# Patient Record
Sex: Female | Born: 1995 | Race: Black or African American | Hispanic: No | Marital: Single | Smoking: Current some day smoker
Health system: Southern US, Community
[De-identification: ages and names within clinical notes are randomized; demographics above are authoritative.]

## PROBLEM LIST (undated history)

## (undated) ENCOUNTER — Emergency Department (HOSPITAL_COMMUNITY): Admission: EM | Payer: Medicaid Other | Source: Home / Self Care

## (undated) DIAGNOSIS — F419 Anxiety disorder, unspecified: Secondary | ICD-10-CM

## (undated) DIAGNOSIS — F329 Major depressive disorder, single episode, unspecified: Secondary | ICD-10-CM

## (undated) DIAGNOSIS — F32A Depression, unspecified: Secondary | ICD-10-CM

---
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
## 2015-01-29 ENCOUNTER — Emergency Department (HOSPITAL_COMMUNITY): Payer: Medicaid Other

## 2015-01-29 ENCOUNTER — Emergency Department (HOSPITAL_COMMUNITY)
Admission: EM | Admit: 2015-01-29 | Discharge: 2015-01-30 | Disposition: A | Discharge status: Other Acute Inpatient Hospital | Payer: Medicaid Other | Attending: Emergency Medicine | Admitting: Emergency Medicine

## 2015-01-29 ENCOUNTER — Encounter (HOSPITAL_COMMUNITY): Payer: Self-pay

## 2015-01-29 DIAGNOSIS — R079 Chest pain, unspecified: Secondary | ICD-10-CM | POA: Diagnosis not present

## 2015-01-29 DIAGNOSIS — Y9289 Other specified places as the place of occurrence of the external cause: Secondary | ICD-10-CM | POA: Insufficient documentation

## 2015-01-29 DIAGNOSIS — R1010 Upper abdominal pain, unspecified: Secondary | ICD-10-CM | POA: Diagnosis not present

## 2015-01-29 DIAGNOSIS — F322 Major depressive disorder, single episode, severe without psychotic features: Secondary | ICD-10-CM | POA: Insufficient documentation

## 2015-01-29 DIAGNOSIS — Y998 Other external cause status: Secondary | ICD-10-CM | POA: Diagnosis not present

## 2015-01-29 DIAGNOSIS — T40602A Poisoning by unspecified narcotics, intentional self-harm, initial encounter: Secondary | ICD-10-CM

## 2015-01-29 DIAGNOSIS — Z3202 Encounter for pregnancy test, result negative: Secondary | ICD-10-CM | POA: Diagnosis not present

## 2015-01-29 DIAGNOSIS — Y9389 Activity, other specified: Secondary | ICD-10-CM | POA: Diagnosis not present

## 2015-01-29 DIAGNOSIS — R51 Headache: Secondary | ICD-10-CM | POA: Diagnosis not present

## 2015-01-29 DIAGNOSIS — T402X2A Poisoning by other opioids, intentional self-harm, initial encounter: Secondary | ICD-10-CM | POA: Diagnosis present

## 2015-01-29 LAB — CBC WITH DIFFERENTIAL/PLATELET
Basophils Absolute: 0 10*3/uL (ref 0.0–0.1)
Basophils Relative: 0 %
EOS PCT: 0 %
Eosinophils Absolute: 0 10*3/uL (ref 0.0–0.7)
HCT: 37.5 % (ref 36.0–46.0)
Hemoglobin: 12.2 g/dL (ref 12.0–15.0)
LYMPHS ABS: 1.3 10*3/uL (ref 0.7–4.0)
LYMPHS PCT: 18 %
MCH: 30.3 pg (ref 26.0–34.0)
MCHC: 32.5 g/dL (ref 30.0–36.0)
MCV: 93.3 fL (ref 78.0–100.0)
MONO ABS: 0.8 10*3/uL (ref 0.1–1.0)
MONOS PCT: 11 %
Neutro Abs: 5.2 10*3/uL (ref 1.7–7.7)
Neutrophils Relative %: 71 %
PLATELETS: 322 10*3/uL (ref 150–400)
RBC: 4.02 MIL/uL (ref 3.87–5.11)
RDW: 12.7 % (ref 11.5–15.5)
WBC: 7.3 10*3/uL (ref 4.0–10.5)

## 2015-01-29 LAB — COMPREHENSIVE METABOLIC PANEL
ALBUMIN: 4 g/dL (ref 3.5–5.0)
ALT: 10 U/L — AB (ref 14–54)
AST: 39 U/L (ref 15–41)
Alkaline Phosphatase: 56 U/L (ref 38–126)
Anion gap: 11 (ref 5–15)
BUN: 13 mg/dL (ref 6–20)
CHLORIDE: 106 mmol/L (ref 101–111)
CO2: 21 mmol/L — AB (ref 22–32)
CREATININE: 0.94 mg/dL (ref 0.44–1.00)
Calcium: 9.4 mg/dL (ref 8.9–10.3)
GFR calc Af Amer: >60 mL/min (ref >60)
GFR calc non Af Amer: >60 mL/min (ref >60)
GLUCOSE: 83 mg/dL (ref 65–99)
POTASSIUM: 5.3 mmol/L — AB (ref 3.5–5.1)
Sodium: 138 mmol/L (ref 135–145)
Total Bilirubin: 1.5 mg/dL — ABNORMAL HIGH (ref 0.3–1.2)
Total Protein: 7.5 g/dL (ref 6.5–8.1)

## 2015-01-29 LAB — URINALYSIS, ROUTINE W REFLEX MICROSCOPIC
Bilirubin Urine: NEGATIVE
Glucose, UA: NEGATIVE mg/dL
HGB URINE DIPSTICK: NEGATIVE
Ketones, ur: >80 mg/dL — AB
Leukocytes, UA: NEGATIVE
Nitrite: NEGATIVE
PH: 6.5 (ref 5.0–8.0)
Protein, ur: NEGATIVE mg/dL
SPECIFIC GRAVITY, URINE: 1.037 — AB (ref 1.005–1.030)

## 2015-01-29 LAB — RAPID URINE DRUG SCREEN, HOSP PERFORMED
AMPHETAMINES: NOT DETECTED
Barbiturates: NOT DETECTED
Benzodiazepines: NOT DETECTED
COCAINE: NOT DETECTED
OPIATES: NOT DETECTED
TETRAHYDROCANNABINOL: NOT DETECTED

## 2015-01-29 LAB — ACETAMINOPHEN LEVEL: Acetaminophen (Tylenol), Serum: <10 ug/mL — ABNORMAL LOW (ref 10–30)

## 2015-01-29 LAB — ETHANOL: Alcohol, Ethyl (B): <5 mg/dL (ref <5)

## 2015-01-29 LAB — PREGNANCY, URINE: Preg Test, Ur: NEGATIVE

## 2015-01-29 LAB — LIPASE, BLOOD: LIPASE: 23 U/L (ref 11–51)

## 2015-01-29 LAB — SALICYLATE LEVEL: Salicylate Lvl: <4 mg/dL (ref 2.8–30.0)

## 2015-01-29 NOTE — ED Notes (Signed)
Pt. To [facility] from ED ambulatory without difficulty, to room 36 . [facility] Pt. Is alert and oriented, warm and dry in no distress. Pt. Denies SI, HI, and AVH. Pt. Calm and cooperative. Pt. Made aware of security cameras and Q15 minute rounds. Pt. Encouraged to let Nursing staff know of any concerns or needs.
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
## 2015-01-29 NOTE — ED Notes (Signed)
[facility]
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
## 2015-01-29 NOTE — ED Notes (Signed)
Bed: [id number] Expected date:  Expected time:  Means of arrival:  Comments: si attempt

## 2015-01-30 ENCOUNTER — Encounter (HOSPITAL_COMMUNITY): Payer: Self-pay | Admitting: *Deleted

## 2015-01-30 ENCOUNTER — Inpatient Hospital Stay (HOSPITAL_COMMUNITY)
Admission: AD | Admit: 2015-01-30 | Discharge: 2015-02-03 | DRG: 885 | Disposition: A | Discharge status: Home / Self Care | Payer: Medicaid Other | Source: Intra-hospital | Attending: Psychiatry | Admitting: Psychiatry

## 2015-01-30 DIAGNOSIS — R45851 Suicidal ideations: Secondary | ICD-10-CM | POA: Diagnosis not present

## 2015-01-30 DIAGNOSIS — T402X2A Poisoning by other opioids, intentional self-harm, initial encounter: Secondary | ICD-10-CM | POA: Diagnosis not present

## 2015-01-30 DIAGNOSIS — Z823 Family history of stroke: Secondary | ICD-10-CM | POA: Diagnosis not present

## 2015-01-30 DIAGNOSIS — G4700 Insomnia, unspecified: Secondary | ICD-10-CM | POA: Diagnosis present

## 2015-01-30 DIAGNOSIS — F323 Major depressive disorder, single episode, severe with psychotic features: Secondary | ICD-10-CM | POA: Diagnosis not present

## 2015-01-30 DIAGNOSIS — F4325 Adjustment disorder with mixed disturbance of emotions and conduct: Secondary | ICD-10-CM | POA: Diagnosis present

## 2015-01-30 DIAGNOSIS — F411 Generalized anxiety disorder: Secondary | ICD-10-CM | POA: Diagnosis present

## 2015-01-30 DIAGNOSIS — Z62810 Personal history of physical and sexual abuse in childhood: Secondary | ICD-10-CM | POA: Diagnosis present

## 2015-01-30 LAB — CBG MONITORING, ED: GLUCOSE-CAPILLARY: 80 mg/dL (ref 65–99)

## 2015-01-30 MED ORDER — PROMETHAZINE HCL 25 MG PO TABS: 25.0000 mg | ORAL_TABLET | Freq: Four times a day (QID) | ORAL | Status: DC | PRN

## 2015-01-30 MED ORDER — TRAZODONE HCL 50 MG PO TABS
50.0000 mg | ORAL_TABLET | Freq: Every evening | ORAL | Status: DC | PRN
  Administered 2015-01-30: 50 mg via ORAL

## 2015-01-30 MED ORDER — SODIUM CHLORIDE 0.9 % IV BOLUS (SEPSIS): 1000.0000 mL | Freq: Once | INTRAVENOUS | Status: DC

## 2015-01-30 MED ORDER — PROMETHAZINE HCL 25 MG PO TABS
25.0000 mg | ORAL_TABLET | Freq: Once | ORAL | Status: AC
  Administered 2015-01-30: 25 mg via ORAL
  Filled 2015-01-30: qty 1

## 2015-01-30 MED ORDER — ALUM & MAG HYDROXIDE-SIMETH 200-200-20 MG/5ML PO SUSP
30.0000 mL | ORAL | Status: DC | PRN
  Administered 2015-02-02: 30 mL via ORAL
  Filled 2015-01-30: qty 30

## 2015-01-30 MED ORDER — ONDANSETRON HCL 4 MG PO TABS
4.0000 mg | ORAL_TABLET | Freq: Three times a day (TID) | ORAL | Status: DC | PRN
  Administered 2015-01-30: 4 mg via ORAL
  Filled 2015-01-30: qty 1

## 2015-01-30 MED ORDER — MAGNESIUM HYDROXIDE 400 MG/5ML PO SUSP: 30.0000 mL | Freq: Every day | ORAL | Status: DC | PRN

## 2015-01-30 NOTE — ED Notes (Signed)
Pt. Given oral fluids. 

## 2015-01-30 NOTE — ED Notes (Addendum)
Dr Deretha EmoryZackowski into see.  Pt declined breakfast, no vomiting since report

## 2015-01-30 NOTE — ED Notes (Signed)
On the phone 

## 2015-01-30 NOTE — ED Notes (Signed)
Friend into see 

## 2015-01-30 NOTE — ED Notes (Signed)
Hold pt until after shift [facility]

## 2015-01-30 NOTE — ED Notes (Signed)
Up on the phone 

## 2015-01-30 NOTE — ED Notes (Signed)
Ashley NottinghamJamison DNP updated, [facility]

## 2015-01-30 NOTE — ED Notes (Addendum)
Pt reports slight nausea when going from laying to sitting, and dizzyness when going from sitting to standing.  Continuing to encouraged PO fluids.  Pt also reports that she has passed out when she has taken a  shower recently.

## 2015-01-30 NOTE — ED Notes (Addendum)
Friend in w/ pt.  Verbal permission from pt to discuss admission w/ friend.  PO fluids encouraged, pt still report itching. nad

## 2015-01-30 NOTE — ED Notes (Signed)
Report received from Lizbeth BarkJanie Rambo RN. Pt. Alert and oriented in no distress denies SI, HI, and pain.  Pt. States she is seeing "people with no faces" and "hearing voices telling her to get up". Pt. Instructed to come to me with problems or concerns.Will continue to monitor for safety via security cameras and Q 15 minute checks.

## 2015-01-30 NOTE — ED Notes (Signed)
Pt. Noted in room. No complaints or concerns voiced. No distress or abnormal behavior noted. Will continue to monitor with security cameras. Q 15 minute rounds continue. 

## 2015-01-30 NOTE — ED Notes (Addendum)
Orthostatic BPs done: Sitting 126/70 HR 92 Standing 117/70 HR 101

## 2015-01-30 NOTE — ED Notes (Signed)
Pt reports that nausea has improved, bit still had some dizzyness when she got up to the phone.  Pt encouraged to drink, saltine crackers given.

## 2015-01-30 NOTE — Tx Team (Signed)
Initial Interdisciplinary Treatment Plan   PATIENT STRESSORS: Financial difficulties Marital or family conflict Occupational concerns   PATIENT STRENGTHS: Average or above average intelligence Communication skills General fund of knowledge   PROBLEM LIST: Problem List/Patient Goals Date to be addressed Date deferred Reason deferred Estimated date of resolution  " not sure" "trying to not want to kill myself" 01-30-15     Depressed 01-30-15     Suicide attempt 01-30-15     Anxious 01-30-15                                    DISCHARGE CRITERIA:  Adequate post-discharge living arrangements Improved stabilization in mood, thinking, and/or behavior Motivation to continue treatment in a less acute level of care Need for constant or close observation no longer present Reduction of life-threatening or endangering symptoms to within safe limits Verbal commitment to aftercare and medication compliance  PRELIMINARY DISCHARGE PLAN: Outpatient therapy Participate in family therapy Placement in alternative living arrangements  PATIENT/FAMIILY INVOLVEMENT: This treatment plan has been presented to and reviewed with the patient, Ashley Kent, and/or family member.  The patient and family have been given the opportunity to ask questions and make suggestions.  Ashley Kent, Ashley Kent 01/30/2015, 11:18 PM

## 2015-01-30 NOTE — ED Notes (Addendum)
Consulted with Ijeoma and EDP concerning patients condition. Will continue to encourage PO fluids and reassess per EDP at 0730 or 0800.

## 2015-01-30 NOTE — ED Notes (Signed)
Dr Elbert EwingsZackoswki updated, [facility]

## 2015-01-30 NOTE — ED Notes (Signed)
PO fluids given

## 2015-01-30 NOTE — ED Notes (Signed)
Pt. Noted sleeping in room. No complaints or concerns voiced. No distress or abnormal behavior noted. Will continue to monitor with security cameras. Q 15 minute rounds continue. 

## 2015-01-30 NOTE — ED Notes (Signed)
Up tot he bathroom to shower and change scrubs 

## 2015-01-30 NOTE — ED Notes (Addendum)
Pt has eatten approx 1/2 of the sandwich w/o difficulty and continues to sip on water

## 2015-01-30 NOTE — ED Notes (Signed)
Pt. C/o nausea and vomited small amount of ginger ale.
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
## 2015-01-30 NOTE — ED Notes (Addendum)
Dr Deretha EmoryZackowski updated

## 2015-01-30 NOTE — ED Notes (Signed)
Pt. Up to walk out with [place] but became dizzy, weak and nauseous. Pt. Assisted back to her room. Orthostatic BPs done. Pt. Assessed by Hulan FessIjeoma Nwaeze NP. Will give antiemetic and encourage ginger ale.

## 2015-01-30 NOTE — ED Notes (Signed)
Sitting quielty in room watching tv. Pt had become startled when she woke up,  Light in room increased, TV on to help distract.  Pt reports that it has helped some.  [facility] is [facility].

## 2015-01-30 NOTE — ED Notes (Addendum)
Up to the bathroom to void, ambulatory w/o difficulty.  Pt reports that she is feeling better,  Encouraged to continue to drink fluids and try to eat some of her lunch. No itching noted

## 2015-01-30 NOTE — ED Notes (Signed)
Ginger ale given with encouragement to drink.

## 2015-01-30 NOTE — ED Notes (Signed)
Up to the bathroom 

## 2015-01-30 NOTE — ED Notes (Signed)
Pt. Drank 7.5 oz ginger ale. Next cup given.

## 2015-01-31 ENCOUNTER — Encounter (HOSPITAL_COMMUNITY): Payer: Self-pay | Admitting: Psychiatry

## 2015-01-31 DIAGNOSIS — R45851 Suicidal ideations: Secondary | ICD-10-CM

## 2015-01-31 DIAGNOSIS — F323 Major depressive disorder, single episode, severe with psychotic features: Principal | ICD-10-CM

## 2015-01-31 MED ORDER — HYDROXYZINE HCL 25 MG PO TABS: 25.0000 mg | ORAL_TABLET | Freq: Four times a day (QID) | ORAL | Status: DC | PRN

## 2015-01-31 MED ORDER — QUETIAPINE FUMARATE 25 MG PO TABS
25.0000 mg | ORAL_TABLET | Freq: Every day | ORAL | Status: DC
  Administered 2015-01-31 – 2015-02-02 (×3): 25 mg via ORAL
  Filled 2015-01-31 (×5): qty 1

## 2015-01-31 MED ORDER — ACETAMINOPHEN 325 MG PO TABS
650.0000 mg | ORAL_TABLET | Freq: Four times a day (QID) | ORAL | Status: DC | PRN
  Administered 2015-01-31 – 2015-02-01 (×2): 650 mg via ORAL
  Filled 2015-01-31 (×2): qty 2

## 2015-01-31 MED ORDER — ESCITALOPRAM OXALATE 5 MG PO TABS
5.0000 mg | ORAL_TABLET | Freq: Every day | ORAL | Status: DC
  Administered 2015-01-31 – 2015-02-01 (×2): 5 mg via ORAL
  Filled 2015-01-31 (×3): qty 1

## 2015-01-31 MED ORDER — ESCITALOPRAM OXALATE 10 MG PO TABS
ORAL_TABLET | ORAL | Status: AC
Start: 2015-01-31 — End: 2015-01-31
  Filled 2015-01-31: qty 1

## 2015-01-31 NOTE — Progress Notes (Signed)
D) Pt has been not attending the groups and interacting with her peers. Affect is flat and mood depressed. Rates her depression as a 6, hopelessness as a 4 and anxiety as an 8. Admits to suicidal ideation but is willing to contract for her safety.  A) Given support, reassurance and praise. Encouragement provided. Provided with a 1:1. R) Pt.maintaining safety.

## 2015-01-31 NOTE — [facility] Suicide Risk Assessment (Signed)
[facility] Admission Suicide Risk Assessment   Nursing information obtained from:    Demographic factors:    Current Mental Status:    Loss Factors:    Historical Factors:    Risk Reduction Factors:    Total Time spent with patient: 30 minutes Principal Problem: MDD (major depressive disorder), single episode, severe with psychosis (HCC) Diagnosis:   Patient Active Problem List   Diagnosis Date Noted  . MDD (major depressive disorder), single episode, severe with psychosis (HCC) [F32.3] 01/31/2015     Continued Clinical Symptoms:  Alcohol Use Disorder Identification Test Final Score (AUDIT): 0 The "Alcohol Use Disorders Identification Test", Guidelines for Use in Primary Care, Second Edition.  World Science writer [facility]). Score between 0-7:  no or low risk or alcohol related problems. Score between 8-15:  moderate risk of alcohol related problems. Score between 16-19:  high risk of alcohol related problems. Score 20 or above:  warrants further diagnostic evaluation for alcohol dependence and treatment.   CLINICAL FACTORS:   Depression:   Anhedonia Hopelessness Impulsivity Insomnia Currently Psychotic   Musculoskeletal: Strength & Muscle Tone: within normal limits Gait & Station: normal Patient leans: N/A  Psychiatric Specialty Exam: Physical Exam  Review of Systems  Psychiatric/Behavioral: Positive for depression, suicidal ideas and hallucinations. The patient is nervous/anxious and has insomnia.   All other systems reviewed and are negative.   Blood pressure 108/72, pulse 104, temperature 98.8 F (37.1 C), temperature source Oral, resp. rate 16, height 5' 4.75" (1.645 m), weight 78.926 kg (174 lb), last menstrual period 01/12/2015.Body mass index is 29.17 kg/(m^2).  General Appearance: Disheveled  Eye Solicitor::  Fair  Speech:  Clear and Coherent  Volume:  Decreased  Mood:  Depressed  Affect:  Depressed  Thought Process:  Goal Directed  Orientation:  Full (Time,  Place, and Person)  Thought Content:  Hallucinations: Auditory  Suicidal Thoughts:  Yes.  without intent/plan  Homicidal Thoughts:  No  Memory:  Immediate;   Fair Recent;   Fair Remote;   Fair  Judgement:  Impaired  Insight:  Shallow  Psychomotor Activity:  Normal  Concentration:  Fair  Recall:  [employer] of Knowledge:Fair  Language: Fair  Akathisia:  No    AIMS (if indicated):     Assets:  Desire for Improvement  Sleep:  Number of Hours: 5  Cognition: WNL  ADL's:  Intact     COGNITIVE FEATURES THAT CONTRIBUTE TO RISK:  Closed-mindedness, Polarized thinking and Thought constriction (tunnel vision)    SUICIDE RISK:   Moderate:  Frequent suicidal ideation with limited intensity, and duration, some specificity in terms of plans, no associated intent, good self-control, limited dysphoria/symptomatology, some risk factors present, and identifiable protective factors, including available and accessible social support.  PLAN OF CARE: Patient will benefit from inpatient treatment and stabilization.  Estimated length of stay is 5-7 days.  Will start a trial of an antidepressant , SSRI for affective sx. Discussed with NP Tanika. Will add Seroquel 25 mg po qhs for psychosis/sleep Will make available PRN medications for anxiety sx. Reviewed past medical records,treatment plan.  Will continue to monitor vitals ,medication compliance and treatment side effects while patient is here.  Will monitor for medical issues as well as call consult as needed.  Reviewed labs - pregnancy test- negative ,UDS- wnl , cbc- wnl, bmp - wnl, CT scan head- negative, EKG - wnl - will order hba1c, lipid panel,tsh, pl. CSW will start working on disposition.  Patient to participate in  therapeutic milieu .       Medical Decision Making:  Review of Psycho-Social Stressors (1), Review or order clinical lab tests (1), Review and summation of old records (2), Established Problem, Worsening (2), Review of Last  Therapy Session (1), Review of Medication Regimen & Side Effects (2) and Review of New Medication or Change in Dosage (2)  I certify that inpatient services furnished can reasonably be expected to improve the patient's condition.   Gay Moncivais MD 01/31/2015, 12:20 PM

## 2015-01-31 NOTE — Progress Notes (Signed)
Adult Psychoeducational Group Note  Date:  01/31/2015 Time:  9:25 PM  Group Topic/Focus:  Wrap-Up Group:   The focus of this group is to help patients review their daily goal of treatment and discuss progress on daily workbooks.  Participation Level:  Minimal  Participation Quality:  Appropriate  Affect:  Flat  Cognitive:  Alert  Insight: Lacking  Engagement in Group:  Lacking  Modes of Intervention:  Discussion  Additional Comments:  Pt stated that her day was a 1 overall. Her goal is to stop having SI thoughts.   Kaleen OdeaCOOKE, Jacoba Cherney R 01/31/2015, 9:25 PM

## 2015-01-31 NOTE — [facility] Group Notes (Signed)
[facility] LCSW Group Therapy  01/31/2015   9 AM  Type of Therapy:  Group Therapy  Participation Level:  None  Participation Quality:  Attentive  Affect:  Depressed and Flat  Cognitive:  Oriented  Insight:  None shared  Engagement in Therapy:  None noted  Modes of Intervention:  Clarification, Discussion, Education, Exploration, Rapport Building, Socialization and Support   Summary of Progress/Problems: The main focus of today's process group was to identify the patient's current support system and decide on other supports that can be put in place. An emphasis was placed on using counselor, doctor, therapy groups, 12-step groups, and problem-specific support groups to expand supports. There was also an extensive discussion about what constitutes a healthy support versus an unhealthy support. Patient remained quiet through out group.   Carney Bernatherine C Ravis Herne, LCSW
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
## 2015-02-01 LAB — TSH: TSH: 0.697 u[IU]/mL (ref 0.350–4.500)

## 2015-02-01 LAB — LIPID PANEL
CHOL/HDL RATIO: 3.7 ratio
CHOLESTEROL: 144 mg/dL (ref 0–200)
HDL: 39 mg/dL — AB (ref >40)
LDL CALC: 83 mg/dL (ref 0–99)
TRIGLYCERIDES: 108 mg/dL (ref <150)
VLDL: 22 mg/dL (ref 0–40)

## 2015-02-01 MED ORDER — ESCITALOPRAM OXALATE 10 MG PO TABS
10.0000 mg | ORAL_TABLET | Freq: Every day | ORAL | Status: DC
  Administered 2015-02-02 – 2015-02-03 (×2): 10 mg via ORAL
  Filled 2015-02-01 (×4): qty 1

## 2015-02-01 NOTE — Progress Notes (Signed)
[facility] MD Progress Note  02/01/2015 3:28 PM Ashley Kent  MRN:  [id number] Subjective:   Patient reports she is feeling " a little better today". She denies medication side effects.  Objective : I have discussed case with treatment team and have met with patient. Patient continues to present depressed, with constricted affect . She does report subjective sense of improvement and states she is hoping to be discharged soon. Milieu, group participation has been limited and she tends to isolate in her room. She ruminates about poor relationship with her mother, whom she describes as hyper critical and not understanding of her depression. States that on discharge she is thinking of going to live with a friend. Denies current suicidal ideations, and contracts for safety on the unit . No medication side effects.  Labs- Lipid panel and TSH unremarkable/ WNL.  Principal Problem: MDD (major depressive disorder), single episode, severe with psychosis ([place]) Diagnosis:   Patient Active Problem List   Diagnosis Date Noted  . MDD (major depressive disorder), single episode, severe with psychosis ([place]) [F32.3] 01/31/2015   Total Time spent with patient: 20 minutes    Past Medical History: History reviewed. No pertinent past medical history. History reviewed. No pertinent past surgical history. Family History:  Family History  Problem Relation Age of Onset  . Stroke Other   . Alcoholism Maternal Uncle     Social History:  History  Alcohol Use No    Comment: occ     History  Drug Use No    Social History   Social History  . Marital Status: Single    Spouse Name: N/A  . Number of Children: N/A  . Years of Education: N/A   Social History Main Topics  . Smoking status: Never Smoker   . Smokeless tobacco: Never Used  . Alcohol Use: No     Comment: occ  . Drug Use: No  . Sexual Activity: Yes    Birth Control/ Protection: None   Other Topics Concern  . None   Social History Narrative    Additional Social History:    Pain Medications: took OD on Oxycodone History of alcohol / drug use?: No history of alcohol / drug abuse  Sleep: Fair  Appetite:  Fair  Current Medications: Current Facility-Administered Medications  Medication Dose Route Frequency Provider Last Rate Last Dose  . acetaminophen (TYLENOL) tablet 650 mg  650 mg Oral Q6H PRN Derrill Center, NP   650 mg at 02/01/15 0825  . alum & mag hydroxide-simeth (MAALOX/MYLANTA) 200-200-20 MG/5ML suspension 30 mL  30 mL Oral Q4H PRN Dara Hoyer, PA-C      . Derrill Memo ON 02/02/2015] escitalopram (LEXAPRO) tablet 10 mg  10 mg Oral Daily Myer Peer Cobos, MD      . hydrOXYzine (ATARAX/VISTARIL) tablet 25 mg  25 mg Oral Q6H PRN Derrill Center, NP      . magnesium hydroxide (MILK OF MAGNESIA) suspension 30 mL  30 mL Oral Daily PRN Dara Hoyer, PA-C      . QUEtiapine (SEROQUEL) tablet 25 mg  25 mg Oral QHS Ursula Alert, MD   25 mg at 01/31/15 2211    Lab Results:  Results for orders placed or performed during the hospital encounter of 01/30/15 (from the past 48 hour(s))  Lipid panel     Status: Abnormal   Collection Time: 02/01/15  6:35 AM  Result Value Ref Range   Cholesterol 144 0 - 200 mg/dL   Triglycerides 108 <150  mg/dL   HDL 39 (L) >40 mg/dL   Total CHOL/HDL Ratio 3.7 RATIO   VLDL 22 0 - 40 mg/dL   LDL Cholesterol 83 0 - 99 mg/dL    Comment:        Total Cholesterol/HDL:CHD Risk Coronary Heart Disease Risk Table                     Men   Women  1/2 Average Risk   3.4   3.3  Average Risk       5.0   4.4  2 X Average Risk   9.6   7.1  3 X Average Risk  23.4   11.0        Use the calculated Patient Ratio above and the CHD Risk Table to determine the patient's CHD Risk.        ATP III CLASSIFICATION (LDL):  <100     mg/dL   Optimal  100-129  mg/dL   Near or Above                    Optimal  130-159  mg/dL   Borderline  160-189  mg/dL   High  >190     mg/dL   Very High Performed at [facility]   TSH     Status: None   Collection Time: 02/01/15  6:35 AM  Result Value Ref Range   TSH 0.697 0.350 - 4.500 uIU/mL    Comment: Performed at [facility]    Physical Findings: AIMS: Facial and Oral Movements Muscles of Facial Expression: None, normal Lips and Perioral Area: None, normal Jaw: None, normal Tongue: None, normal,Extremity Movements Upper (arms, wrists, hands, fingers): None, normal Lower (legs, knees, ankles, toes): None, normal, Trunk Movements Neck, shoulders, hips: None, normal, Overall Severity Severity of abnormal movements (highest score from questions above): None, normal Incapacitation due to abnormal movements: None, normal Patient's awareness of abnormal movements (rate only patient's report): No Awareness, Dental Status Current problems with teeth and/or dentures?: No Does patient usually wear dentures?: No  CIWA:  CIWA-Ar Total: 0 COWS:     Musculoskeletal: Strength & Muscle Tone: within normal limits Gait & Station: normal Patient leans: N/A  Psychiatric Specialty Exam: ROS no chest pain, no shortness of breath, no vomiting, no rash   Blood pressure 126/55, pulse 99, temperature 98.4 F (36.9 C), temperature source Oral, resp. rate 16, height 5' 4.75" (1.645 m), weight 174 lb (78.926 kg), last menstrual period 01/12/2015.Body mass index is 29.17 kg/(m^2).  General Appearance: Fairly Groomed  Engineer, water::  Good  Speech:  Normal Rate  Volume:  Decreased  Mood:  Depressed, but states mood is improving   Affect:  Constricted- does smile briefly at times   Thought Process:  Linear  Orientation:  Other:  fully alert and attentive   Thought Content:  denies hallucinations, no delusions   Suicidal Thoughts:  No- today denies any suicidal ideations  Or self injurious ideations  Homicidal Thoughts:  No  Memory:  recent and remote grossly intact   Judgement:  Fair  Insight:  Fair  Psychomotor Activity:  Normal   Concentration:  Good  Recall:  Good  Fund of Knowledge:Good  Language: Good  Akathisia:  Negative  Handed:  Right  AIMS (if indicated):     Assets:  Desire for Improvement Physical Health Resilience  ADL's:  Intact  Cognition: WNL  Sleep:  Number of Hours: 5  Assessment -  patient presents depressed, constricted in affect, sad, but describes subjective improvement compared to admission. Attributes depression in part to poor relationship with mother. At this time tolerating medications well and does not endorse medication side effects.  Limited milieu participation. Treatment Plan Summary: Daily contact with patient to assess and evaluate symptoms and progress in treatment, Medication management, Plan inpatient treatment and medications as below Encourage increased group /milieu participation to work on coping skills and symptom reduction Increase LEXAPRO to 10 mgrs QDAY for depression , anxiety. Side effects reviewed  Continue SEROQUEL 25 mgrs QHS for insomnia, night time anxiety Continue VISTARIL 25 mgrs Q 6 hours PRN for anxiety as needed   COBOS, FERNANDO 02/01/2015, 3:28 PM

## 2015-02-01 NOTE — Tx Team (Signed)
Interdisciplinary Treatment Plan Update (Adult) Date: 02/01/2015   Date: 02/01/2015 1:02 PM  Progress in Treatment:  Attending groups: Yes  Participating in groups: Yes  Taking medication as prescribed: Yes  Tolerating medication: Yes  Family/Significant othe contact made: No, CSW attempting to make contact with friend Patient understands diagnosis: Yes AEB seeking help with depression Discussing patient identified problems/goals with staff: Yes  Medical problems stabilized or resolved: Yes  Denies suicidal/homicidal ideation: Yes Patient has not harmed self or Others: Yes   New problem(s) identified: None identified at this time.   Discharge Plan or Barriers: Pt will return home and follow up with outpatient services  Additional comments:  Patient and CSW reviewed pt's identified goals and treatment plan. Patient verbalized understanding and agreed to treatment plan. CSW reviewed [facility] "Discharge Process and Patient Involvement" Form. Pt verbalized understanding of information provided and signed form.   Reason for Continuation of Hospitalization:  Anxiety Depression Medication stabilization Suicidal ideation  Estimated length of stay: 1-3 days  Review of initial/current patient goals per problem list:   1.  Goal(s): Patient will participate in aftercare plan  Met:  Yes  Target date: 3-5 days from date of admission   As evidenced by: Patient will participate within aftercare plan AEB aftercare provider and housing plan at discharge being identified.  02/01/15: Pt will return home and follow-up with outpatient services   2.  Goal (s): Patient will exhibit decreased depressive symptoms and suicidal ideations.  Met:  Progressing  Target date: 3-5 days from date of admission   As evidenced by: Patient will utilize self rating of depression at 3 or below and demonstrate decreased signs of depression or be deemed stable for discharge by MD.  02/01/15: Pt rates depression at  5/10; denies SI  3.  Goal(s): Patient will demonstrate decreased signs and symptoms of anxiety.  Met:  Progressing  Target date: 3-5 days from date of admission   As evidenced by: Patient will utilize self rating of anxiety at 3 or below and demonstrated decreased signs of anxiety, or be deemed stable for discharge by MD  02/01/15: Pt rates anxiety at 5/10.   Attendees:  Patient:    Family:    Physician: Dr. Parke Poisson, MD  02/01/2015 1:02 PM  Nursing:   02/01/2015 1:02 PM  Clinical Social Worker Peri Maris, [place] 02/01/2015 1:02 PM  Other: Tilden Fossa, [place] 02/01/2015 1:02 PM  Clinical:  Darrol Angel, RN 02/01/2015 1:02 PM  Other: , RN Charge Nurse 02/01/2015 1:02 PM  Other: Hilda Lias, [place], [place] Social Work [phone]

## 2015-02-01 NOTE — [facility] Group Notes (Signed)
[facility]  02/01/2015 8:45 AM  Participation Quality: Alert, Appropriate and Oriented  Mood/Affect: Flat  Depression Rating: 5   Anxiety Rating: 5  Thoughts of Suicide: Pt denies SI/HI  Will you contract for safety? Yes  Current AVH: Pt denies  Plan for Discharge/Comments: Pt attended discharge planning group and actively participated in group. CSW discussed suicide prevention education with the group and encouraged them to discuss discharge planning and any relevant barriers. Pt reports that she is tired this morning but feeling "okay." No needs expressed at this time.  Transportation Means: Pt reports access to transportation  Supports: No supports mentioned at this time  Chad CordialLauren Carter, LCSWA 02/01/2015 9:49 AM

## 2015-02-01 NOTE — Progress Notes (Signed)
Recreation Therapy Notes  Date: 01.16.2017 Time: 9:30am Location: [address] Group Room  Group Topic: Stress Management  Goal Area(s) Addresses:  Patient will actively participate in stress management techniques presented during session.   Behavioral Response: Did not attend.   Abrianna Sidman L Taijon Vink, LRT/CTRS        Arriona Prest L 02/01/2015 11:57 AM 

## 2015-02-01 NOTE — Progress Notes (Signed)
D: Pt presents with flat affect and depressed mood. Pt rates depression 6/10. Hopeless 4/10. Anxiety 9/10. Pt denies suicidal thoughts. Pt c/o ongoing headache, pt given tylenol at her request. Pt reported difficulty sleeping last night. Pt signed 72 hour request for discharge form at 1056 am today.  A: Medications reviewed with pt. Medications administered as ordered per MD. Verbal support given. Pt encouraged to attend groups. 15 minute checks performed for safety. R: Pt verbalized understanding of medication regimen. Pt stated goal is to work on discharge. Pt contracts for safety.

## 2015-02-01 NOTE — Plan of Care (Signed)
Problem: Alteration in mood Goal: LTG-Patient reports reduction in suicidal thoughts (Patient reports reduction in suicidal thoughts and is able to verbalize a safety plan for whenever patient is feeling suicidal)  Outcome: Progressing Pt denies suicidal thoughts. Pt verbally contracts for safety.     
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
## 2015-02-01 NOTE — Progress Notes (Signed)
Pt did not attend wrap-up group   

## 2015-02-01 NOTE — B[facility] Group Notes (Signed)
[facility] LCSW Group Therapy  02/01/2015 1:15pm  Type of Therapy:  Group Therapy vercoming Obstacles  Pt did not attend, declined invitation.   Chad CordialLauren Carter, LCSWA 02/01/2015 3:01 PM

## 2015-02-02 LAB — PROLACTIN: PROLACTIN: 27.2 ng/mL — AB (ref 4.8–23.3)

## 2015-02-02 LAB — HEMOGLOBIN A1C
HEMOGLOBIN A1C: 5.2 % (ref 4.8–5.6)
MEAN PLASMA GLUCOSE: 103 mg/dL

## 2015-02-02 NOTE — Plan of Care (Signed)
Problem: Alteration in mood Goal: LTG-Pt's behavior demonstrates decreased signs of depression (Patient's behavior demonstrates decreased signs of depression to the point the patient is safe to return home and continue treatment in an outpatient setting)  Outcome: Progressing Patient reports not feeling depressed anymore.

## 2015-02-02 NOTE — Progress Notes (Signed)
D-  Patient has been actively participating in groups and engaged in unit activities.  Patient had no complaints this shift and states that she thinks that she plans on discharging soon.  Patient denies SI, HI and AVH.   A- Assess patient for safety, offer medications as prescribed engage patient in 1:1 Therapeutic talks.   R-  Patient able to contract for safety.

## 2015-02-02 NOTE — Progress Notes (Signed)
D: Patient alert and oriented x 4. Patient denies pain/SI/HI/AVH. Patient states, "I am doing a lot better,especially since I am going home tomorrow." Patient denies any depression to this Clinical research associate.  A: Staff to monitor Q 15 mins for safety. Encouragement and support offered. Scheduled medications administered per orders. R: Patient remains safe on the unit. Patient did not attended group tonight. Patient visible on the unit and interacting with peers. Patient taking administered medications.

## 2015-02-02 NOTE — Progress Notes (Signed)
Recreation Therapy Notes  Animal-Assisted Activity (AAA) Program Checklist/Progress Notes Patient Eligibility Criteria Checklist & Daily Group note for Rec Tx Intervention  Date: 01.17.2017 Time: 2:45pm Location: [address]    AAA/T Program Assumption of Risk Form signed by Patient/ or Parent Legal Guardian yes  Patient is free of allergies or sever asthma yes  Patient reports no fear of animals yes  Patient reports no history of cruelty to animals yes  Patient understands his/her participation is voluntary yes  Behavioral Response: Did not attend   [employer], LRT/CTRS  Govind Furey L 02/02/2015 2:57 PM
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
## 2015-02-02 NOTE — B[facility] Suicide Risk Assessment (Signed)
[facility] INPATIENT:  Family/Significant Other Suicide Prevention Education  Suicide Prevention Education:  Education Completed; Ramiro Harvest, Pt's friend [phone],  has been identified by the patient as the family member/significant other with whom the patient will be residing, and identified as the person(s) who will aid the patient in the event of a mental health crisis (suicidal ideations/suicide attempt).  With written consent from the patient, the family member/significant other has been provided the following suicide prevention education, prior to the and/or following the discharge of the patient.  The suicide prevention education provided includes the following:  Suicide risk factors  Suicide prevention and interventions  National Suicide Hotline telephone number  [facility] assessment telephone number  [facility] Emergency Assistance 911  [facility] and/or Residential Mobile Crisis Unit telephone number  Request made of family/significant other to:  Remove weapons (e.g., guns, rifles, knives), all items previously/currently identified as safety concern.    Remove drugs/medications (over-the-counter, prescriptions, illicit drugs), all items previously/currently identified as a safety concern.  The family member/significant other verbalizes understanding of the suicide prevention education information provided.  The family member/significant other agrees to remove the items of safety concern listed above.  Elaina Hoops 02/02/2015, 4:19 PM

## 2015-02-02 NOTE — Plan of Care (Signed)
Problem: Ineffective individual coping Goal: STG: Patient will remain free from self harm Outcome: Progressing Patient denies SI during assessment.

## 2015-02-02 NOTE — B[facility] Group Notes (Signed)
B[facility] LCSW Group Therapy 02/02/2015 1:15 PM  Type of Therapy: Group Therapy- Feelings about Diagnosis  Participation Level: Minimal  Participation Quality:  N/A did not participate  Affect:  Appropriate  Cognitive: Alert and Oriented   Insight:  Unable to assess  Engagement in Therapy: Minimal  Modes of Intervention: Clarification, Confrontation, Discussion, Education, Exploration, Limit-setting, Orientation, Problem-solving, Rapport Building, Dance movement psychotherapist, Socialization and Support  Description of Group:   This group will allow patients to explore their thoughts and feelings about diagnoses they have received. Patients will be guided to explore their level of understanding and acceptance of these diagnoses. Facilitator will encourage patients to process their thoughts and feelings about the reactions of others to their diagnosis, and will guide patients in identifying ways to discuss their diagnosis with significant others in their lives. This group will be process-oriented, with patients participating in exploration of their own experiences as well as giving and receiving support and challenge from other group members.  Summary of Progress/Problems:  Pt did not participate in group discussion, but remained attentive throughout.  Therapeutic Modalities:   Cognitive Behavioral Therapy Solution Focused Therapy Motivational Interviewing Relapse Prevention Therapy  Chad Cordial, LCSWA 02/02/2015 4:10 PM

## 2015-02-03 MED ORDER — QUETIAPINE FUMARATE 25 MG PO TABS: 25.0000 mg | ORAL_TABLET | Freq: Every day | ORAL | Status: DC

## 2015-02-03 MED ORDER — ESCITALOPRAM OXALATE 10 MG PO TABS: 10.0000 mg | ORAL_TABLET | Freq: Every day | ORAL | Status: DC

## 2015-02-03 MED ORDER — HYDROXYZINE HCL 25 MG PO TABS: 25.0000 mg | ORAL_TABLET | Freq: Four times a day (QID) | ORAL | Status: DC | PRN

## 2015-02-03 NOTE — Progress Notes (Signed)
  [facility] Adult Case Management Discharge Plan :  Will you be returning to the same living situation after discharge:  Yes,  Pt returning to friend's home At discharge, do you have transportation home?: Yes,  Pt friend to provide transportation Do you have the ability to pay for your medications: Yes,  Pt provided with prescriptions  Release of information consent forms completed and in the chart;  Patient's signature needed at discharge.  Patient to Follow up at: Follow-up Information    Follow up with [facility].   Specialty:  Behavioral Health   Why:  Please walk-in between 8am-3pm Monday-Friday within 7 days of your discharge to be seen for your initial assessment for medication management and therapy.   Contact information:   [address] [place] [address] [phone]       Next level of care provider has access to [facility] Link:no  Safety Planning and Suicide Prevention discussed: Yes,  with friend; see SPE note for further details  Have you used any form of tobacco in the last 30 days? (Cigarettes, Smokeless Tobacco, Cigars, and/or Pipes): No  Has patient been referred to the Quitline?: N/A patient is not a smoker  Patient has been referred for addiction treatment: N/A  Elaina Hoops 02/03/2015, 10:37 AM

## 2015-02-03 NOTE — Tx Team (Signed)
Interdisciplinary Treatment Plan Update (Adult) Date: 02/03/2015   Date: 02/03/2015 10:31 AM  Progress in Treatment:  Attending groups: Yes  Participating in groups: Yes  Taking medication as prescribed: Yes  Tolerating medication: Yes  Family/Significant othe contact made: Yes with friend Patient understands diagnosis: Yes AEB seeking help with depression Discussing patient identified problems/goals with staff: Yes  Medical problems stabilized or resolved: Yes  Denies suicidal/homicidal ideation: Yes Patient has not harmed self or Others: Yes   New problem(s) identified: None identified at this time.   Discharge Plan or Barriers: Pt will return home and follow up with outpatient services  Additional comments:  Patient and CSW reviewed pt's identified goals and treatment plan. Patient verbalized understanding and agreed to treatment plan. CSW reviewed [facility] "Discharge Process and Patient Involvement" Form. Pt verbalized understanding of information provided and signed form.   Reason for Continuation of Hospitalization:  Anxiety Depression Medication stabilization Suicidal ideation  Estimated length of stay: 0 days; Pt stable for DC  Review of initial/current patient goals per problem list:   1.  Goal(s): Patient will participate in aftercare plan  Met:  Yes  Target date: 3-5 days from date of admission   As evidenced by: Patient will participate within aftercare plan AEB aftercare provider and housing plan at discharge being identified.  02/01/15: Pt will return home and follow-up with outpatient services   2.  Goal (s): Patient will exhibit decreased depressive symptoms and suicidal ideations.  Met:  Yes  Target date: 3-5 days from date of admission   As evidenced by: Patient will utilize self rating of depression at 3 or below and demonstrate decreased signs of depression or be deemed stable for discharge by MD.  02/01/15: Pt rates depression at 5/10; denies  SI  02/03/15: Pt rates depression at 0/10; denies SI  3.  Goal(s): Patient will demonstrate decreased signs and symptoms of anxiety.  Met:  Yes  Target date: 3-5 days from date of admission   As evidenced by: Patient will utilize self rating of anxiety at 3 or below and demonstrated decreased signs of anxiety, or be deemed stable for discharge by MD  02/01/15: Pt rates anxiety at 5/10.   02/03/15: Pt rates anxiety at 0/10  Attendees:  Patient:    Family:    Physician: Dr. Parke Poisson, MD  02/03/2015 10:31 AM  Nursing:   02/03/2015 10:31 AM  Clinical Social Worker Peri Maris, [place] 02/03/2015 10:31 AM  Other: Erasmo Downer Drinkard, [place] 02/03/2015 10:31 AM  Clinical:  Darrol Angel, RN 02/03/2015 10:31 AM  Other: , RN Charge Nurse 02/03/2015 10:31 AM  Other: Hilda Lias, [place], [place] [phone]
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
## 2015-02-03 NOTE — Progress Notes (Signed)
D: Patient alert and oriented x 4. Patient denies pain SI/HI/AVH.  A: Staff to monitor Q 15 mins for safety. Encouragement and support offered. Scheduled medications administered per orders. R: Patient remains safe on the unit. Patient attended group tonight. Patient visible on hte unit and interacting with peers. Patient taking administered medications.  

## 2015-02-03 NOTE — BHH Group Notes (Signed)
Adult Psychoeducational Group Note  Date:  02/03/2015 Time:  12:24 AM  Group Topic/Focus:  Wrap-Up Group:   The focus of this group is to help patients review their daily goal of treatment and discuss progress on daily workbooks.  Participation Level:  Minimal  Participation Quality:  Appropriate  Affect:  Appropriate  Cognitive:  Appropriate  Insight: Appropriate  Engagement in Group:  Developing/Improving  Modes of Intervention:  Discussion  Additional Comments:  Pt goal for today was to be discharged, but states that she is leaving tomorrow.   Lyndee Hensen 02/03/2015, 12:24 AM

## 2015-02-03 NOTE — Discharge Summary (Signed)
Physician Discharge Summary Note  Patient:  Ashley Kent is an 20 y.o., female MRN:  [id number] DOB:  1995-06-28 Patient phone:  [phone] (home)  Patient address:   [place] [address],  Total Time spent with patient: 30 minutes  Date of Admission:  01/30/2015 Date of Discharge: 02/03/2015  Reason for Admission:  Worsening depression  Principal Problem: MDD (major depressive disorder), single episode, severe with psychosis [facility]) Discharge Diagnoses: Patient Active Problem List   Diagnosis Date Noted  . MDD (major depressive disorder), single episode, severe with psychosis (HCC) [F32.3] 01/31/2015    Past Psychiatric History:  See above noted   Past Medical History: History reviewed. No pertinent past medical history. History reviewed. No pertinent past surgical history. Family History:  Family History  Problem Relation Age of Onset  . Stroke Other   . Alcoholism Maternal Uncle    Family Psychiatric  History:  See above noted Social History:  History  Alcohol Use No    Comment: occ     History  Drug Use No    Social History   Social History  . Marital Status: Single    Spouse Name: N/A  . Number of Children: N/A  . Years of Education: N/A   Social History Main Topics  . Smoking status: Never Smoker   . Smokeless tobacco: Never Used  . Alcohol Use: No     Comment: occ  . Drug Use: No  . Sexual Activity: Yes    Birth Control/ Protection: None   Other Topics Concern  . None   Social History Narrative    Hospital Course:  Taylan Mayhan was admitted for MDD (major depressive disorder), single episode, severe with psychosis (HCC) and crisis management.  She was treated with the following medications listed below.  Laneka Mcgrory was discharged with current medication and was instructed on how to take medications as prescribed; (details listed below under Medication List).  Medical problems were identified and treated as needed.  Home  medications were restarted as appropriate.  Improvement was monitored by observation and Dominic Pea daily report of symptom reduction.  Emotional and mental status was monitored by daily self-inventory reports completed by Dominic Pea and clinical staff.         Irie Dowson was evaluated by the treatment team for stability and plans for continued recovery upon discharge.  Dominic Pea motivation was an integral factor for scheduling further treatment.  Employment, transportation, bed availability, health status, family support, and any pending legal issues were also considered during her hospital stay.  She was offered further treatment options upon discharge including but not limited to Residential, Intensive Outpatient, and Outpatient treatment.  Madilyn Cephas will follow up with the services as listed below under Follow Up Information.     Upon completion of this admission the Avonell Lenig was both mentally and medically stable for discharge denying suicidal/homicidal ideation, auditory/visual/tactile hallucinations, delusional thoughts and paranoia.      Physical Findings: AIMS: Facial and Oral Movements Muscles of Facial Expression: None, normal Lips and Perioral Area: None, normal Jaw: None, normal Tongue: None, normal,Extremity Movements Upper (arms, wrists, hands, fingers): None, normal Lower (legs, knees, ankles, toes): None, normal, Trunk Movements Neck, shoulders, hips: None, normal, Overall Severity Severity of abnormal movements (highest score from questions above): None, normal Incapacitation due to abnormal movements: None, normal Patient's awareness of abnormal movements (rate only patient's report): No Awareness, Dental Status Current problems with teeth and/or dentures?: No Does patient usually wear dentures?: No  CIWA:  CIWA-Ar Total: 0 COWS:     Musculoskeletal: Strength & Muscle Tone: within normal limits Gait & Station: normal Patient leans:  N/A  Psychiatric Specialty Exam:  SEE MD SRA Review of Systems  All other systems reviewed and are negative.   Blood pressure 120/70, pulse 102, temperature 99.1 F (37.3 C), temperature source Oral, resp. rate 16, height 5' 4.75" (1.645 m), weight 78.926 kg (174 lb), last menstrual period 01/12/2015.Body mass index is 29.17 kg/(m^2).  Have you used any form of tobacco in the last 30 days? (Cigarettes, Smokeless Tobacco, Cigars, and/or Pipes): No  Has this patient used any form of tobacco in the last 30 days? (Cigarettes, Smokeless Tobacco, Cigars, and/or Pipes) Yes, deferred  Metabolic Disorder Labs:  Lab Results  Component Value Date   HGBA1C 5.2 02/01/2015   MPG 103 02/01/2015   Lab Results  Component Value Date   PROLACTIN 27.2* 02/01/2015   Lab Results  Component Value Date   CHOL 144 02/01/2015   TRIG 108 02/01/2015   HDL 39* 02/01/2015   CHOLHDL 3.7 02/01/2015   VLDL 22 02/01/2015   LDLCALC 83 02/01/2015    See Psychiatric Specialty Exam and Suicide Risk Assessment completed by Attending Physician prior to discharge.  Discharge destination:  Home  Is patient on multiple antipsychotic therapies at discharge:  No   Has Patient had three or more failed trials of antipsychotic monotherapy by history:  No  Recommended Plan for Multiple Antipsychotic Therapies: NA     Medication List    STOP taking these medications        doxycycline 100 MG capsule  Commonly known as:  VIBRAMYCIN     ibuprofen 200 MG tablet  Commonly known as:  ADVIL,MOTRIN     metroNIDAZOLE 500 MG tablet  Commonly known as:  FLAGYL      TAKE these medications      Indication   escitalopram 10 MG tablet  Commonly known as:  LEXAPRO  Take 1 tablet (10 mg total) by mouth daily.   Indication:  Generalized Anxiety Disorder, Major Depressive Disorder     hydrOXYzine 25 MG tablet  Commonly known as:  ATARAX/VISTARIL  Take 1 tablet (25 mg total) by mouth every 6 (six) hours as needed for  anxiety.   Indication:  Anxiety Neurosis     QUEtiapine 25 MG tablet  Commonly known as:  SEROQUEL  Take 1 tablet (25 mg total) by mouth at bedtime.   Indication:  mood stabilization       Follow-up Information    Follow up with [facility].   Specialty:  Behavioral Health   Why:  Please walk-in between 8am-3pm Monday-Friday within 7 days of your discharge to be seen for your initial assessment for medication management and therapy.   [address] [place] [address] [phone]       Follow-up recommendations:  Activity:  as tol Diet:  as tol  Comments:  1.  Take all your medications as prescribed.              2.  Report any adverse side effects to outpatient provider.                       3.  Patient instructed to not use alcohol or illegal drugs while on prescription medicines.            4.  In the event of worsening symptoms, instructed patient to  call 911, the crisis hotline or go to nearest emergency room for evaluation of symptoms.  Signed: Velna Hatchet May Agustin AGNP-BC 02/03/2015, 11:13 AM  Patient seen, Suicide Assessment Completed.  Disposition Plan Reviewed

## 2015-02-03 NOTE — Progress Notes (Signed)
Discharge note: Pt receive both written and verbal discharge instructions. Pt verbalized understanding of discharge instructions. Pt agreed to f/u appt and med regimen. Pt received prescriptions and beongings. Pt safely discharged to the lobby.

## 2015-02-03 NOTE — Progress Notes (Signed)
Recreation Therapy Notes  Date: 01.18.2017 Time: 9:30am Location: [address] Dayroom   Group Topic: Stress Management  Goal Area(s) Addresses:  Patient will actively participate in stress management techniques presented during session.   Behavioral Response: Did not attend.    Pinki Rottman L Hagan Maltz, LRT/CTRS        Dereon Williamsen L 02/03/2015 11:46 AM 

## 2015-04-26 ENCOUNTER — Emergency Department (HOSPITAL_COMMUNITY)
Admission: EM | Admit: 2015-04-26 | Discharge: 2015-04-26 | Disposition: A | Discharge status: Home / Self Care | Payer: Medicaid Other | Attending: Emergency Medicine | Admitting: Emergency Medicine

## 2015-04-26 ENCOUNTER — Encounter (HOSPITAL_COMMUNITY): Payer: Self-pay | Admitting: Emergency Medicine

## 2015-04-26 DIAGNOSIS — F17200 Nicotine dependence, unspecified, uncomplicated: Secondary | ICD-10-CM | POA: Diagnosis not present

## 2015-04-26 DIAGNOSIS — R112 Nausea with vomiting, unspecified: Secondary | ICD-10-CM | POA: Diagnosis not present

## 2015-04-26 DIAGNOSIS — F419 Anxiety disorder, unspecified: Secondary | ICD-10-CM | POA: Diagnosis not present

## 2015-04-26 DIAGNOSIS — R109 Unspecified abdominal pain: Secondary | ICD-10-CM | POA: Diagnosis present

## 2015-04-26 DIAGNOSIS — F329 Major depressive disorder, single episode, unspecified: Secondary | ICD-10-CM | POA: Insufficient documentation

## 2015-04-26 DIAGNOSIS — Z79899 Other long term (current) drug therapy: Secondary | ICD-10-CM | POA: Diagnosis not present

## 2015-04-26 DIAGNOSIS — R350 Frequency of micturition: Secondary | ICD-10-CM | POA: Diagnosis not present

## 2015-04-26 DIAGNOSIS — R51 Headache: Secondary | ICD-10-CM | POA: Insufficient documentation

## 2015-04-26 DIAGNOSIS — R519 Headache, unspecified: Secondary | ICD-10-CM

## 2015-04-26 DIAGNOSIS — Z3202 Encounter for pregnancy test, result negative: Secondary | ICD-10-CM | POA: Insufficient documentation

## 2015-04-26 HISTORY — DX: Anxiety disorder, unspecified: F41.9

## 2015-04-26 HISTORY — DX: Depression, unspecified: F32.A

## 2015-04-26 HISTORY — DX: Major depressive disorder, single episode, unspecified: F32.9

## 2015-04-26 LAB — CBC
HCT: 39.6 % (ref 36.0–46.0)
Hemoglobin: 12.9 g/dL (ref 12.0–15.0)
MCH: 30.1 pg (ref 26.0–34.0)
MCHC: 32.6 g/dL (ref 30.0–36.0)
MCV: 92.5 fL (ref 78.0–100.0)
PLATELETS: 266 10*3/uL (ref 150–400)
RBC: 4.28 MIL/uL (ref 3.87–5.11)
RDW: 12.9 % (ref 11.5–15.5)
WBC: 5.2 10*3/uL (ref 4.0–10.5)

## 2015-04-26 LAB — COMPREHENSIVE METABOLIC PANEL
ALK PHOS: 68 U/L (ref 38–126)
ALT: 18 U/L (ref 14–54)
AST: 20 U/L (ref 15–41)
Albumin: 4.3 g/dL (ref 3.5–5.0)
Anion gap: 10 (ref 5–15)
BUN: 11 mg/dL (ref 6–20)
CALCIUM: 9.5 mg/dL (ref 8.9–10.3)
CHLORIDE: 102 mmol/L (ref 101–111)
CO2: 24 mmol/L (ref 22–32)
CREATININE: 0.66 mg/dL (ref 0.44–1.00)
Glucose, Bld: 78 mg/dL (ref 65–99)
Potassium: 3.7 mmol/L (ref 3.5–5.1)
SODIUM: 136 mmol/L (ref 135–145)
Total Bilirubin: 0.7 mg/dL (ref 0.3–1.2)
Total Protein: 8.9 g/dL — ABNORMAL HIGH (ref 6.5–8.1)

## 2015-04-26 LAB — LIPASE, BLOOD: LIPASE: 18 U/L (ref 11–51)

## 2015-04-26 LAB — URINALYSIS, ROUTINE W REFLEX MICROSCOPIC
BILIRUBIN URINE: NEGATIVE
GLUCOSE, UA: NEGATIVE mg/dL
HGB URINE DIPSTICK: NEGATIVE
KETONES UR: 40 mg/dL — AB
Leukocytes, UA: NEGATIVE
Nitrite: NEGATIVE
PROTEIN: NEGATIVE mg/dL
Specific Gravity, Urine: 1.026 (ref 1.005–1.030)
pH: 6 (ref 5.0–8.0)

## 2015-04-26 LAB — I-STAT BETA HCG BLOOD, ED (MC, WL, AP ONLY)

## 2015-04-26 MED ORDER — ONDANSETRON 4 MG PO TBDP: 4.0000 mg | ORAL_TABLET | Freq: Three times a day (TID) | ORAL | Status: DC | PRN

## 2015-04-26 MED ORDER — METOCLOPRAMIDE HCL 5 MG/ML IJ SOLN
10.0000 mg | Freq: Once | INTRAMUSCULAR | Status: AC
  Administered 2015-04-26: 10 mg via INTRAVENOUS
  Filled 2015-04-26: qty 2

## 2015-04-26 MED ORDER — DIPHENHYDRAMINE HCL 50 MG/ML IJ SOLN
12.5000 mg | Freq: Once | INTRAMUSCULAR | Status: AC
  Administered 2015-04-26: 12.5 mg via INTRAVENOUS
  Filled 2015-04-26: qty 1

## 2015-04-26 MED ORDER — SODIUM CHLORIDE 0.9 % IV BOLUS (SEPSIS)
1000.0000 mL | Freq: Once | INTRAVENOUS | Status: AC
  Administered 2015-04-26: 1000 mL via INTRAVENOUS

## 2015-04-26 NOTE — Discharge Instructions (Signed)
1. Medications: zofran for nausea, tylenol or ibuprofen for headache, usual home medications 2. Treatment: rest, drink plenty of fluids 3. Follow Up: please followup with the [facility] (go to walk in clinic on Thursday or Friday) for discussion of your diagnoses and further evaluation after today's visit; if you do not have a primary care doctor use the phone number listed in your discharge paperwork to find one; please return to the ER for severe headache, increased abdominal pain, persistent vomiting, new or worsening symptoms   Abdominal Pain, Adult Many things can cause belly (abdominal) pain. Most times, the belly pain is not dangerous. Many cases of belly pain can be watched and treated at home. HOME CARE   Do not take medicines that help you go poop (laxatives) unless told to by your doctor.  Only take medicine as told by your doctor.  Eat or drink as told by your doctor. Your doctor will tell you if you should be on a special diet. GET HELP IF:  You do not know what is causing your belly pain.  You have belly pain while you are sick to your stomach (nauseous) or have runny poop (diarrhea).  You have pain while you pee or poop.  Your belly pain wakes you up at night.  You have belly pain that gets worse or better when you eat.  You have belly pain that gets worse when you eat fatty foods.  You have a fever. GET HELP RIGHT AWAY IF:   The pain does not go away within 2 hours.  You keep throwing up (vomiting).  The pain changes and is only in the right or left part of the belly.  You have bloody or tarry looking poop. MAKE SURE YOU:   Understand these instructions.  Will watch your condition.  Will get help right away if you are not doing well or get worse.   This information is not intended to replace advice given to you by your health care provider. Make sure you discuss any questions you have with your health care provider.   Document Released:  06/21/2007 Document Revised: 01/23/2014 Document Reviewed: 09/11/2012 Elsevier Interactive Patient Education 2016 [employer].  Migraine Headache A migraine headache is very bad, throbbing pain on one or both sides of your head. Talk to your doctor about what things may bring on (trigger) your migraine headaches. HOME CARE  Only take medicines as told by your doctor.  Lie down in a dark, quiet room when you have a migraine.  Keep a journal to find out if certain things bring on migraine headaches. For example, write down:  What you eat and drink.  How much sleep you get.  Any change to your diet or medicines.  Lessen how much alcohol you drink.  Quit smoking if you smoke.  Get enough sleep.  Lessen any stress in your life.  Keep lights dim if bright lights bother you or make your migraines worse. GET HELP RIGHT AWAY IF:   Your migraine becomes really bad.  You have a fever.  You have a stiff neck.  You have trouble seeing.  Your muscles are weak, or you lose muscle control.  You lose your balance or have trouble walking.  You feel like you will pass out (faint), or you pass out.  You have really bad symptoms that are different than your first symptoms. MAKE SURE YOU:   Understand these instructions.  Will watch your condition.  Will get help  right away if you are not doing well or get worse.   This information is not intended to replace advice given to you by your health care provider. Make sure you discuss any questions you have with your health care provider.   Document Released: 10/12/2007 Document Revised: 03/27/2011 Document Reviewed: 09/09/2012 Elsevier Interactive Patient Education [employer] Else[employer] Inc.

## 2015-04-26 NOTE — ED Provider Notes (Signed)
CSN: [id number]     Arrival date & time 04/26/15  1829 History   First MD Initiated Contact with Patient 04/26/15 2028     Chief Complaint  Patient presents with  . Abdominal Pain    HPI   Ashley Kent is a 20 y.o. female with a PMH of anxiety and depression who presents to the ED with headache, abdominal cramping, nausea, and vomiting. She reports her headache started 3 days ago. She states it was gradual in onset and has progressively worsened. She reports dizziness, lightheadedness, and blurry vision earlier today, but states this is now resolved. She denies fever, chills, neck pain. She states she has a history of headaches and that her symptoms feel similar. She also notes intermittent abdominal cramping, nausea, and vomiting for the past 2-3 weeks. She denies exacerbating or alleviating factors. She reports urinary frequency. She denies diarrhea, constipation, dysuria, urgency, vaginal discharge, vaginal bleeding.   Past Medical History  Diagnosis Date  . Anxiety   . Depression    History reviewed. No pertinent past surgical history. Family History  Problem Relation Age of Onset  . Stroke Other   . Alcoholism Maternal Uncle    Social History  Substance Use Topics  . Smoking status: Current Some Day Smoker  . Smokeless tobacco: Never Used  . Alcohol Use: No     Comment: occ   OB History    Gravida Para Term Preterm AB TAB SAB Ectopic Multiple Living   0               Review of Systems  Constitutional: Negative for fever and chills.  Gastrointestinal: Positive for nausea, vomiting and abdominal pain. Negative for diarrhea and constipation.  Genitourinary: Positive for frequency. Negative for dysuria, urgency, vaginal bleeding and vaginal discharge.  All other systems reviewed and are negative.     Allergies  Orange (diagnostic); Sunscreens; and Zyrtec  Home Medications   Prior to Admission medications   Medication Sig Start Date End Date Taking? Authorizing  Provider  escitalopram (LEXAPRO) 10 MG tablet Take 1 tablet (10 mg total) by mouth daily. 02/03/15  Yes Adonis BrookSheila Agustin, NP  hydrOXYzine (ATARAX/VISTARIL) 25 MG tablet Take 1 tablet (25 mg total) by mouth every 6 (six) hours as needed for anxiety. 02/03/15  Yes Adonis BrookSheila Agustin, NP  QUEtiapine (SEROQUEL) 25 MG tablet Take 1 tablet (25 mg total) by mouth at bedtime. 02/03/15  Yes Adonis BrookSheila Agustin, NP  ondansetron (ZOFRAN ODT) 4 MG disintegrating tablet Take 1 tablet (4 mg total) by mouth every 8 (eight) hours as needed for nausea. 04/26/15   Mady GemmaElizabeth C Westfall, PA-C    BP 113/71 mmHg  Pulse 85  Temp(Src) 99.1 F (37.3 C) (Oral)  Resp 16  Ht 5' 4.75" (1.645 m)  Wt 78.926 kg  BMI 29.17 kg/m2  SpO2 100% Physical Exam  Constitutional: She is oriented to person, place, and time. She appears well-developed and well-nourished. No distress.  HENT:  Head: Normocephalic and atraumatic.  Right Ear: External ear normal.  Left Ear: External ear normal.  Nose: Nose normal.  Mouth/Throat: Uvula is midline, oropharynx is clear and moist and mucous membranes are normal.  Eyes: Conjunctivae, EOM and lids are normal. Pupils are equal, round, and reactive to light. Right eye exhibits no discharge. Left eye exhibits no discharge. No scleral icterus.  Neck: Normal range of motion. Neck supple.  No nuchal rigidity.  Cardiovascular: Normal rate, regular rhythm, normal heart sounds, intact distal pulses and normal pulses.  Pulmonary/Chest: Effort normal and breath sounds normal. No respiratory distress. She has no wheezes. She has no rales.  Abdominal: Soft. Normal appearance and bowel sounds are normal. She exhibits no distension and no mass. There is no tenderness. There is no rigidity, no rebound and no guarding.  Abdomen soft, nontender, nondistended.  Musculoskeletal: Normal range of motion. She exhibits no edema or tenderness.  Neurological: She is alert and oriented to person, place, and time. She has normal  strength. No cranial nerve deficit or sensory deficit. Coordination and gait normal.  Patient ambulates without difficulty.  Skin: Skin is warm, dry and intact. No rash noted. She is not diaphoretic. No erythema. No pallor.  Psychiatric: She has a normal mood and affect. Her speech is normal and behavior is normal.  Nursing note and vitals reviewed.   ED Course  Procedures (including critical care time)  Labs Review Labs Reviewed  COMPREHENSIVE METABOLIC PANEL - Abnormal; Notable for the following:    Total Protein 8.9 (*)    All other components within normal limits  URINALYSIS, ROUTINE W REFLEX MICROSCOPIC (NOT AT [facility]) - Abnormal; Notable for the following:    APPearance CLOUDY (*)    Ketones, ur 40 (*)    All other components within normal limits  LIPASE, BLOOD  CBC  I-STAT BETA HCG BLOOD, ED ([facility], [facility], [facility] ONLY)    Imaging Review No results found.   I have personally reviewed and evaluated these lab results as part of my medical decision-making.   EKG Interpretation None      MDM   Final diagnoses:  Headache, unspecified headache type  Abdominal pain, unspecified abdominal location  Non-intractable vomiting with nausea, vomiting of unspecified type    20 year old female presents with headache for the past 3 days as well as intermittent abdominal cramping, nausea, and vomiting x 2-3 weeks. Reports dizziness, lightheadedness, and blurry vision earlier today, now resolved. Denies fever, chills, neck pain. States she has a history of headaches and that her symptoms feel similar. Reports urinary frequency. Denies diarrhea, constipation, dysuria, urgency, vaginal discharge, vaginal bleeding.  Patient's temp 99.1. Vital signs stable. Heart regular rate and rhythm. Lungs clear to auscultation bilaterally. Abdomen soft, nontender, nondistended. No rebound, guarding, or masses. Normal neuro exam with no focal deficit. No nuchal rigidity. Patient ambulates without  difficulty.  Patient given fluids, reglan, and benadryl.   CBC negative for leukocytosis or anemia. CMP unremarkable. Lipase within normal limits. Beta hCG negative. UA negative for infection.   On reassessment of patient, she reports significant symptom improvement and states her headache is resolved. Repeat abdominal exam benign. Discussed findings with patient. She is nontoxic and well-appearing, feel she is stable for discharge at this time. Presentation consistent with patient's history of headache, doubt ICH, SAH, meningitis. Patient to follow up with PCP, information for [place] [facility] given. Will give zofran for home. Strict return precautions discussed. Patient verbalizes her understanding and is in agreement with plan.  BP 113/71 mmHg  Pulse 85  Temp(Src) 99.1 F (37.3 C) (Oral)  Resp 16  Ht 5' 4.75" (1.645 m)  Wt 78.926 kg  BMI 29.17 kg/m2  SpO2 100%      Mady Gemma, PA-C 04/27/15 0132  Laurence Spates, MD 04/28/15 1511

## 2015-04-26 NOTE — ED Notes (Signed)
Pt from home with complaints of headache and lower abdominal cramps. Pt states this feels like she is having her period, but she is not. Pt states she also has urinary frequency. Pt states her headache has been present x 3 days and her abdominal pain has been lingering x 1-2 weeks

## 2015-11-06 ENCOUNTER — Encounter (HOSPITAL_COMMUNITY): Payer: Self-pay | Admitting: Emergency Medicine

## 2015-11-06 ENCOUNTER — Emergency Department (HOSPITAL_COMMUNITY): Payer: Medicaid Other

## 2015-11-06 ENCOUNTER — Emergency Department (HOSPITAL_COMMUNITY)
Admission: EM | Admit: 2015-11-06 | Discharge: 2015-11-07 | Disposition: A | Discharge status: Home / Self Care | Payer: Medicaid Other | Attending: Emergency Medicine | Admitting: Emergency Medicine

## 2015-11-06 DIAGNOSIS — Z87891 Personal history of nicotine dependence: Secondary | ICD-10-CM | POA: Insufficient documentation

## 2015-11-06 DIAGNOSIS — E876 Hypokalemia: Secondary | ICD-10-CM | POA: Insufficient documentation

## 2015-11-06 DIAGNOSIS — R55 Syncope and collapse: Secondary | ICD-10-CM | POA: Insufficient documentation

## 2015-11-06 LAB — CBC
HCT: 38.2 % (ref 36.0–46.0)
HEMOGLOBIN: 12.6 g/dL (ref 12.0–15.0)
MCH: 30.6 pg (ref 26.0–34.0)
MCHC: 33 g/dL (ref 30.0–36.0)
MCV: 92.7 fL (ref 78.0–100.0)
PLATELETS: 285 10*3/uL (ref 150–400)
RBC: 4.12 MIL/uL (ref 3.87–5.11)
RDW: 12.6 % (ref 11.5–15.5)
WBC: 9.5 10*3/uL (ref 4.0–10.5)

## 2015-11-06 LAB — BASIC METABOLIC PANEL
ANION GAP: 9 (ref 5–15)
BUN: 6 mg/dL (ref 6–20)
CALCIUM: 9.4 mg/dL (ref 8.9–10.3)
CHLORIDE: 103 mmol/L (ref 101–111)
CO2: 24 mmol/L (ref 22–32)
CREATININE: 0.78 mg/dL (ref 0.44–1.00)
GFR calc non Af Amer: >60 mL/min (ref >60)
Glucose, Bld: 114 mg/dL — ABNORMAL HIGH (ref 65–99)
Potassium: 3.1 mmol/L — ABNORMAL LOW (ref 3.5–5.1)
SODIUM: 136 mmol/L (ref 135–145)

## 2015-11-06 MED ORDER — KETOROLAC TROMETHAMINE 60 MG/2ML IM SOLN
60.0000 mg | Freq: Once | INTRAMUSCULAR | Status: AC
  Administered 2015-11-07: 60 mg via INTRAMUSCULAR
  Filled 2015-11-06: qty 2

## 2015-11-06 MED ORDER — ACETAMINOPHEN 500 MG PO TABS
1000.0000 mg | ORAL_TABLET | Freq: Once | ORAL | Status: AC
  Administered 2015-11-07: 1000 mg via ORAL
  Filled 2015-11-06: qty 2

## 2015-11-06 NOTE — ED Triage Notes (Addendum)
Patient at friends house states she "felt dizzy and remembers falling" bystanders stated she fell headfirst on to forehead on concrete. Hematoma noted. Patient c/o head pain and neck pain. Cspine stabilized. Right shoulder pain "stinging." Patient hx of anemia (has not been taking her iron pills". States last episode was 3 weeks ago and was not seen for it. Patient alert and oriented x 4.

## 2015-11-06 NOTE — ED Provider Notes (Signed)
[facility]Y DEPT Provider Note   CSN: [id number] Arrival date & time: 11/06/15  2007     History   Chief Complaint Chief Complaint  Patient presents with  . Loss of Consciousness    HPI Ashley Kent is a 20 y.o. female.  HPI  Standing talking to friend at friends house, started to feel dizzy, like room spinning and like going to pass out, then passed out.  Brother think she was out for maybe 2 sec.  Then woke right up and was scared.  No tongue biting, no incontinence, no seizure like activity. No nausea, no diaphoresis, no chest pain.  Before it happened felt like "couldn't breath". However no shortness of breath at this time. Head hurting and eyes hurting looking at light.  No n/v.  Has had syncope in hte past. Was taking a bath and when got up had syncope, boyfriend saw it.  Today felt similar. Had another episode when she stood up from bed then felt dizzy and passed out.    No change of medications recently. Stopped psych meds over one month ago.  Poor appetite and poor eating up until last week however eating well now.  No SI/depression. No drug use. Smoked a black and mild today. No other new over the counter medicines.   Past Medical History:  Diagnosis Date  . Anxiety   . Depression     Patient Active Problem List   Diagnosis Date Noted  . 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 No pertinent surgical history.  OB History    Gravida Para Term Preterm AB Living   0             SAB TAB Ectopic Multiple Live Births                   Home Medications    Prior to Admission medications   Medication Sig Start Date End Date Taking? Authorizing Provider  escitalopram (LEXAPRO) 10 MG tablet Take 1 tablet (10 mg total) by mouth daily. 02/03/15   Adonis Brook, NP  hydrOXYzine (ATARAX/VISTARIL) 25 MG tablet Take 1 tablet (25 mg total) by mouth every 6 (six) hours as needed for anxiety. 02/03/15    Adonis Brook, NP  ondansetron (ZOFRAN ODT) 4 MG disintegrating tablet Take 1 tablet (4 mg total) by mouth every 8 (eight) hours as needed for nausea. 04/26/15   Mady Gemma, PA-C  potassium chloride (K-DUR) 10 MEQ tablet Take 1 tablet (10 mEq total) by mouth 2 (two) times daily. 11/07/15 11/09/15  Alvira Monday, MD  QUEtiapine (SEROQUEL) 25 MG tablet Take 1 tablet (25 mg total) by mouth at bedtime. 02/03/15   Adonis Brook, NP    Family History Family History  Problem Relation Age of Onset  . Stroke Other   . Alcoholism Maternal Uncle     Social History Social History  Substance Use Topics  . Smoking status: Former Games developer  . Smokeless tobacco: Never Used  . Alcohol use No     Comment: occ     Allergies   Orange (diagnostic); Sunscreens; and Zyrtec [cetirizine]   Review of Systems Review of Systems  Constitutional: Negative for appetite change and fever.  HENT: Negative for sore throat.   Eyes: Negative for visual disturbance.  Respiratory: Negative for cough and shortness of breath.   Cardiovascular: Negative for chest pain.  Gastrointestinal: Negative for abdominal pain, blood in stool, constipation, diarrhea, nausea and vomiting.  Genitourinary: Negative for difficulty urinating and vaginal bleeding.  Musculoskeletal: Negative for back pain and neck pain.  Skin: Negative for rash.  Neurological: Positive for syncope, light-headedness and headaches. Negative for weakness and numbness.     Physical Exam Updated Vital Signs BP 107/70   Pulse 71   Resp 19   Wt 141 lb (64 kg)   LMP  (LMP Unknown)   SpO2 100%   BMI 23.65 kg/m   Physical Exam  Constitutional: She is oriented to person, place, and time. She appears well-developed and well-nourished. No distress.  HENT:  Head: Normocephalic.  4cm hematoma right forehead  Eyes: Conjunctivae and EOM are normal.  Neck: Normal range of motion.  Cardiovascular: Normal rate, regular rhythm, normal heart  sounds and intact distal pulses.  Exam reveals no gallop and no friction rub.   No murmur heard. Pulmonary/Chest: Effort normal and breath sounds normal. No respiratory distress. She has no wheezes. She has no rales.  Abdominal: Soft. She exhibits no distension. There is no tenderness. There is no guarding.  Musculoskeletal: She exhibits no edema or tenderness.  Neurological: She is alert and oriented to person, place, and time. She has normal strength. No cranial nerve deficit or sensory deficit. Coordination and gait normal. GCS eye subscore is 4. GCS verbal subscore is 5. GCS motor subscore is 6.  Skin: Skin is warm and dry. No rash noted. She is not diaphoretic. No erythema.  Nursing note and vitals reviewed.    ED Treatments / Results  Labs (all labs ordered are listed, but only abnormal results are displayed) Labs Reviewed  BASIC METABOLIC PANEL - Abnormal; Notable for the following:       Result Value   Potassium 3.1 (*)    Glucose, Bld 114 (*)    All other components within normal limits  CBC  POC URINE PREG, ED    EKG  EKG Interpretation None       Radiology Ct Head Wo Contrast  Result Date: 11/06/2015 CLINICAL DATA:  Patient at friends house states she "felt dizzy and remembers falling" bystanders stated she fell headfirst on to forehead on concrete. Hematoma noted. Patient c/o head pain and neck pain. Cspine stabilized. Anemia. EXAM: CT HEAD WITHOUT CONTRAST CT CERVICAL SPINE WITHOUT CONTRAST TECHNIQUE: Multidetector CT imaging of the head and cervical spine was performed following the standard protocol without intravenous contrast. Multiplanar CT image reconstructions of the cervical spine were also generated. COMPARISON:  01/29/2015 FINDINGS: CT HEAD FINDINGS Brain: There is no intra or extra-axial fluid collection or mass lesion. The basilar cisterns and ventricles have a normal appearance. There is no CT evidence for acute infarction or hemorrhage. Vascular: No  hyperdense vessel or unexpected calcification. Skull: Normal. Negative for fracture or focal lesion. Right frontal scalp hematoma is noted not associated with fracture. Sinuses/Orbits: No acute finding. Other: None CT CERVICAL SPINE FINDINGS Alignment: The cervical spine is slightly flexed or there is loss of cervical lordosis which may be secondary to splinting, positioning, or soft tissue injury. Skull base and vertebrae: Unremarkable. Soft tissues and spinal canal: No prevertebral fluid or swelling. No visible canal hematoma. Disc levels:  Unremarkable. Upper chest: Negative. Other: None IMPRESSION: Right frontal scalp hematoma. No evidence for acute intracranial abnormality. No evidence for acute cervical spine abnormality. Electronically Signed   By: Norva Pavlov M.D.   On: 11/06/2015 22:08   Ct Cervical Spine Wo Contrast  Result Date: 11/06/2015 CLINICAL DATA:  Patient at friends house states she "felt  dizzy and remembers falling" bystanders stated she fell headfirst on to forehead on concrete. Hematoma noted. Patient c/o head pain and neck pain. Cspine stabilized. Anemia. EXAM: CT HEAD WITHOUT CONTRAST CT CERVICAL SPINE WITHOUT CONTRAST TECHNIQUE: Multidetector CT imaging of the head and cervical spine was performed following the standard protocol without intravenous contrast. Multiplanar CT image reconstructions of the cervical spine were also generated. COMPARISON:  01/29/2015 FINDINGS: CT HEAD FINDINGS Brain: There is no intra or extra-axial fluid collection or mass lesion. The basilar cisterns and ventricles have a normal appearance. There is no CT evidence for acute infarction or hemorrhage. Vascular: No hyperdense vessel or unexpected calcification. Skull: Normal. Negative for fracture or focal lesion. Right frontal scalp hematoma is noted not associated with fracture. Sinuses/Orbits: No acute finding. Other: None CT CERVICAL SPINE FINDINGS Alignment: The cervical spine is slightly flexed or  there is loss of cervical lordosis which may be secondary to splinting, positioning, or soft tissue injury. Skull base and vertebrae: Unremarkable. Soft tissues and spinal canal: No prevertebral fluid or swelling. No visible canal hematoma. Disc levels:  Unremarkable. Upper chest: Negative. Other: None IMPRESSION: Right frontal scalp hematoma. No evidence for acute intracranial abnormality. No evidence for acute cervical spine abnormality. Electronically Signed   By: Norva Pavlov M.D.   On: 11/06/2015 22:08    Procedures Procedures (including critical care time)  Medications Ordered in ED Medications  ketorolac (TORADOL) injection 60 mg (60 mg Intramuscular Given 11/07/15 0012)  acetaminophen (TYLENOL) tablet 1,000 mg (1,000 mg Oral Given 11/07/15 0012)  potassium chloride SA (K-DUR,KLOR-CON) CR tablet 40 mEq (40 mEq Oral Given 11/07/15 0121)     Initial Impression / Assessment and Plan / ED Course  I have reviewed the triage vital signs and the nursing notes.  Pertinent labs & imaging results that were available during my care of the patient were reviewed by me and considered in my medical decision making (see chart for details).  Clinical Course    20 year old female with a history of anxiety and depression presents with concern for syncope. Patient with hematoma, headache, light sensitivity, and per her syncope protocol CT head was placed at time of triage. Patient had also reported midline cervical spine tenderness, and CT of head and cervical spine showed no acute abnormalities. EKG shows a sinus tachycardia, without signs of delta waves, no Brugada, no signs of focal, and borderline QT on my evaluation. Pregnancy test negative. Very well, and patient was given 40 mEq of potassium in the emergency department, and prescription for 10 mEq twice a day for 2 days. No history to suggest seizure-like activity. Patient without chest pain, no continued shortness of breath, no hypoxia, have low  suspicion for pulmonary embolus. She has a history of 2 other syncopal events, which occurred when she went from sitting to standing, and given lightheadedness present prior to this episode, suspect likely vasovagal.  Patient does admit that she had not been eating much prior to last week. She does not have signs of fluid overload, and have low suspicion for refeeding syndrome. Denies any other new medications or drug use. Has been off of her psychiatric medications for over one month and doubt withdrawal, in addition pt denies SI/depression. Patient is well-appearing. Headache is improved with Toradol and Tylenol in the emergency department. Recommend continued hydration at home, finding PCP, Cardiology for holter monitoring if desired.   Final Clinical Impressions(s) / ED Diagnoses   Final diagnoses:  Syncope, unspecified syncope type, suspect vasovagal  Hypokalemia  New Prescriptions Discharge Medication List as of 11/07/2015  1:15 AM    START taking these medications   Details  potassium chloride (K-DUR) 10 MEQ tablet Take 1 tablet (10 mEq total) by mouth 2 (two) times daily., Starting Sun 11/07/2015, Until Tue 11/09/2015, Print         Alvira MondayErin Davonne Jarnigan, MD 11/07/15 [phone]

## 2015-11-07 LAB — POC URINE PREG, ED: Preg Test, Ur: NEGATIVE

## 2015-11-07 MED ORDER — POTASSIUM CHLORIDE CRYS ER 20 MEQ PO TBCR
40.0000 meq | EXTENDED_RELEASE_TABLET | Freq: Once | ORAL | Status: AC
  Administered 2015-11-07: 40 meq via ORAL
  Filled 2015-11-07: qty 2

## 2015-11-07 MED ORDER — POTASSIUM CHLORIDE ER 10 MEQ PO TBCR: 10.0000 meq | EXTENDED_RELEASE_TABLET | Freq: Two times a day (BID) | ORAL | 0 refills | Status: DC

## 2015-11-07 NOTE — ED Notes (Signed)
MD at bedside. 

## 2016-05-29 ENCOUNTER — Emergency Department (HOSPITAL_COMMUNITY): Payer: Self-pay

## 2016-05-29 ENCOUNTER — Emergency Department (HOSPITAL_COMMUNITY)
Admission: EM | Admit: 2016-05-29 | Discharge: 2016-05-29 | Disposition: A | Discharge status: Home / Self Care | Payer: Self-pay | Attending: Emergency Medicine | Admitting: Emergency Medicine

## 2016-05-29 ENCOUNTER — Encounter (HOSPITAL_COMMUNITY): Payer: Self-pay | Admitting: Emergency Medicine

## 2016-05-29 DIAGNOSIS — M79641 Pain in right hand: Secondary | ICD-10-CM | POA: Insufficient documentation

## 2016-05-29 DIAGNOSIS — Y929 Unspecified place or not applicable: Secondary | ICD-10-CM | POA: Insufficient documentation

## 2016-05-29 DIAGNOSIS — W2201XA Walked into wall, initial encounter: Secondary | ICD-10-CM | POA: Insufficient documentation

## 2016-05-29 DIAGNOSIS — Y999 Unspecified external cause status: Secondary | ICD-10-CM | POA: Insufficient documentation

## 2016-05-29 DIAGNOSIS — Y939 Activity, unspecified: Secondary | ICD-10-CM | POA: Insufficient documentation

## 2016-05-29 NOTE — ED Provider Notes (Signed)
[facility]PT Provider Note   CSN: [id number] Arrival date & time: 05/29/16  1346  By signing my name below, I, [name] and [name], attest that this documentation has been prepared under the direction and in the presence of  [facility], PA-C. Electronically Signed: Georgeanna Lea, ED Scribe. 05/29/16. 4:31 PM.   History   Chief Complaint Chief Complaint  Patient presents with  . Hand Injury    HPI Katelyn Patel is a 21 y.o. female presents to the Emergency Department complaining of gradually worsening right thumb pain s/p injury that occurred 2 days ago. Pt states she accidentally struck her right thumb and base of hand against the wall while tending to her dog. She additionally complains of numbness to the thumb, and reports that it has been "locking up" intermittently in a flexed position since the injury. Pt states her pain is worsened with thumb movement. No medications tried PTA, but pt states she has tried "exercising" the thumb with small movements with no relief of pain. She denies additional injuries.    The history is provided by the patient. No language interpreter was used.    History reviewed. No pertinent past medical history.  There are no active problems to display for this patient.   History reviewed. No pertinent surgical history.  OB History    No data available     Home Medications    Prior to Admission medications   Not on File    Family History No family history on file.  Social History Social History  Substance Use Topics  . Smoking status: Not on file  . Smokeless tobacco: Not on file  . Alcohol use Not on file    Allergies   Watermelon flavor  Review of Systems Review of Systems  Musculoskeletal: Positive for arthralgias.  Neurological: Positive for numbness.    Physical Exam Updated Vital Signs BP 110/74   Pulse 66   Temp 98 F (36.7 C) (Oral)   Resp 12   LMP 05/09/2016    SpO2 100%   Physical Exam  Constitutional: She appears well-developed and well-nourished. No distress.  HENT:  Head: Normocephalic and atraumatic.  Neck: Neck supple.  Cardiovascular: Normal rate, regular rhythm and normal heart sounds.   No murmur heard. Pulmonary/Chest: Effort normal and breath sounds normal. No respiratory distress. She has no wheezes. She has no rales.  Musculoskeletal:  Right hand with tenderness to palpation of anatomical snuff box and distal right thumb. No swelling. Difficulty with active thumb opposition and extension of DIP. Able to pincer grasp but weak. Subjective numbness to light touch of distal thumb. 2+ radial pulse.  Neurological: She is alert.  Skin: Skin is warm and dry.  Nursing note and vitals reviewed.   ED Treatments / Results  DIAGNOSTIC STUDIES: Oxygen Saturation is 100% on RA, normal by my interpretation.   COORDINATION OF CARE: 4:30 PM-Discussed next steps with pt which includes XR. Pt verbalized understanding and is agreeable with the plan.   Radiology Dg Hand Complete Right  Result Date: 05/29/2016 CLINICAL DATA:  Pain of the first and second digits. EXAM: RIGHT HAND - COMPLETE 3+ VIEW COMPARISON:  Earlier same day FINDINGS: No bony abnormality seen on these images. I am not convinced that the distal phalanx of the thumb indeed has a fracture. That image could have been artifactual/misleading. No fracture is seen on any other image of this exam or the thumb exam. No evidence of arthritis or other focal  finding. IMPRESSION: Negative radiographs. I think there is doubt as to if there is in fact a fracture of the distal phalanx of the thumb. I favor there is not. Electronically Signed   By: Paulina FusiMark  Shogry M.D.   On: 05/29/2016 17:39   Dg Finger Thumb Right  Result Date: 05/29/2016 CLINICAL DATA:  Pain right thumb. EXAM: RIGHT THUMB 2+V COMPARISON:  No recent prior. FINDINGS: Fracture base of the distal phalanx of the right thumb is noted. No  radiopaque foreign body . No other focal abnormality . IMPRESSION: Nondisplaced fracture of the base of the distal phalanx of the right thumb cannot be excluded. No other focal abnormalities identified . Electronically Signed   By: Maisie Fushomas  Register   On: 05/29/2016 16:23    Procedures Procedures (including critical care time)  SPLINT APPLICATION Date/Time: 6:51 PM Authorized by: Chase PicketJaime Pilcher Zamir Staples Consent: Verbal consent obtained. Risks and benefits: risks, benefits and alternatives were discussed Consent given by: patient Splint applied by: orthopedic technician Location details: Right hand Splint type: thumb spica Post-procedure: The splinted body part was neurovascularly unchanged following the procedure. Patient tolerance: Patient tolerated the procedure well with no immediate complications.    Medications Ordered in ED Medications - No data to display   Initial Impression / Assessment and Plan / ED Course  I have reviewed the triage vital signs and the nursing notes.  Pertinent imaging results that were available during my care of the patient were reviewed by me and considered in my medical decision making (see chart for details).              Katelyn Patel is a [age] y.o. female who presents to ED for right thumb and hand pain s/p hitting hand against the wall two days ago. On exam, she is having difficulty with certain motions of the thumb such as opposition and extension of the DIP. Keeps DIP of thumb in a flexed position. Also has subjective numbness to tip of thumb and tenderness to anatomical snuffbox. Initial x-ray of thumb ordered in triage shows possible fracture to the base of distal phalanx. I added an x-ray of the hand to assess for scaphoid fx due to snuffbox tenderness. On image of the hand, fracture is not present. Hand image negative. Given she has tenderness to tip of thumb and snuffbox, along with ROM exam findings, patient placed in thumb spica splint and  will refer to hand. Patient re-evaluated following splint placement and comfortable with good perfusion. She understands home care instructions, follow up care and return precautions. All questions answered.   Patient seen by and discussed with Dr. Rhunette CroftNanavati who agrees with treatment plan.   Final Clinical Impressions(s) / ED Diagnoses   Final diagnoses:  Right hand pain    New Prescriptions New Prescriptions   No medications on file   I personally performed the services described in this documentation, which was scribed in my presence. The recorded information has been reviewed and is accurate.     Kyi Romanello, Chase PicketJaime Pilcher, PA-C 05/29/16 1851    Derwood KaplanNanavati, Ankit, MD 05/29/16 2134

## 2016-05-29 NOTE — Discharge Instructions (Signed)
It was my pleasure taking care of you today!   Please call the hand surgery clinic listed tomorrow morning to schedule a follow up appointment.  Ibuprofen as needed for pain.   Return to ER for new or worsening symptoms, any additional concerns.

## 2016-05-29 NOTE — ED Triage Notes (Signed)
Pt states she was trying to stop her puppy from eating out of the trash can on Saturday when she accidentally smacked the wall with her right thumb. Pain and swelling present to right thumb

## 2016-05-29 NOTE — Progress Notes (Signed)
Orthopedic Tech Progress Note Patient Details:  Katelyn Patel Sep 18, 1995 [id number]  Ortho Devices Type of Ortho Device: Ace wrap, Thumb spica splint, Arm sling Ortho Device/Splint Interventions: Application   [name] 05/29/2016, 6:21 PM

## 2016-05-29 NOTE — ED Notes (Signed)
Contacted Ortho tech at this time.

## 2016-07-19 ENCOUNTER — Encounter (HOSPITAL_COMMUNITY): Payer: Self-pay

## 2016-07-19 ENCOUNTER — Emergency Department (HOSPITAL_COMMUNITY): Payer: Self-pay

## 2016-07-19 ENCOUNTER — Emergency Department (HOSPITAL_COMMUNITY)
Admission: EM | Admit: 2016-07-19 | Discharge: 2016-07-19 | Disposition: A | Discharge status: Home / Self Care | Payer: Self-pay | Attending: Emergency Medicine | Admitting: Emergency Medicine

## 2016-07-19 ENCOUNTER — Ambulatory Visit (HOSPITAL_COMMUNITY)
Admission: EM | Admit: 2016-07-19 | Discharge: 2016-07-19 | Disposition: A | Discharge status: Other Acute Inpatient Hospital | Payer: Self-pay

## 2016-07-19 DIAGNOSIS — M25562 Pain in left knee: Secondary | ICD-10-CM | POA: Insufficient documentation

## 2016-07-19 DIAGNOSIS — W19XXXA Unspecified fall, initial encounter: Secondary | ICD-10-CM

## 2016-07-19 DIAGNOSIS — Y9389 Activity, other specified: Secondary | ICD-10-CM | POA: Insufficient documentation

## 2016-07-19 DIAGNOSIS — H539 Unspecified visual disturbance: Secondary | ICD-10-CM | POA: Insufficient documentation

## 2016-07-19 DIAGNOSIS — F17210 Nicotine dependence, cigarettes, uncomplicated: Secondary | ICD-10-CM | POA: Insufficient documentation

## 2016-07-19 DIAGNOSIS — M545 Low back pain: Secondary | ICD-10-CM | POA: Insufficient documentation

## 2016-07-19 DIAGNOSIS — Y929 Unspecified place or not applicable: Secondary | ICD-10-CM | POA: Insufficient documentation

## 2016-07-19 DIAGNOSIS — Y999 Unspecified external cause status: Secondary | ICD-10-CM | POA: Insufficient documentation

## 2016-07-19 DIAGNOSIS — M542 Cervicalgia: Secondary | ICD-10-CM | POA: Insufficient documentation

## 2016-07-19 DIAGNOSIS — W0110XA Fall on same level from slipping, tripping and stumbling with subsequent striking against unspecified object, initial encounter: Secondary | ICD-10-CM | POA: Insufficient documentation

## 2016-07-19 DIAGNOSIS — R55 Syncope and collapse: Secondary | ICD-10-CM | POA: Insufficient documentation

## 2016-07-19 LAB — PREGNANCY, URINE: Preg Test, Ur: NEGATIVE

## 2016-07-19 NOTE — ED Notes (Signed)
Paged ortho tech at this time.

## 2016-07-19 NOTE — Progress Notes (Signed)
Orthopedic Tech Progress Note Patient Details:  Katelyn Patel [birth year] [id number]  Ortho Devices Type of Ortho Device: Knee Sleeve Ortho Device/Splint Location: LLE Ortho Device/Splint Interventions: Ordered, Application   Jennye MoccasinHughes, Katelyn Patel 07/19/2016, 4:39 PM

## 2016-07-19 NOTE — ED Triage Notes (Signed)
Patient reports that she slipped and fell yesterday while at work. Complains of head pain, left leg pain, left arm pain. No loc. ambulatory

## 2016-07-19 NOTE — ED Notes (Signed)
Patient transported to X-ray 

## 2016-07-19 NOTE — ED Notes (Signed)
Pt requesting to eat, this RN advised pt she needs to remain NPO until after her scans are finished.

## 2016-07-19 NOTE — Discharge Instructions (Signed)
Take ibuprofen or Tylenol as needed for pain. Wear knee sleeve as directed. Follow-up with PCP for further evaluation. Return to ED for worsening pain, additional injury, vision changes, numbness, weakness, inability to walk, headache, additional injury.

## 2016-07-19 NOTE — ED Provider Notes (Signed)
[facility]-EMERGENCY DEPT Provider Note   CSN: [id number] Arrival date & time: 07/19/16  1253  By signing my name below, I, Sonum Patel, attest that this documentation has been prepared under the direction and in the presence of Lurlene Ronda, PA-C.Marland Kitchen Electronically Signed: Sonum Patel, Neurosurgeon. 07/19/16. 1:19 PM.  History   Chief Complaint No chief complaint on file.   The history is provided by the patient and a relative. No language interpreter was used.     HPI Comments: Katelyn Patel is a 21 y.o. female who presents to the Emergency Department complaining of a fall that occurred yesterday at work. 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 She notes striking her head and does not recall anything after that. A family member states she lost consciousness after the fall and then once again after she was escorted to the car. She was given a cold shower to keep her awake but family member states she was acting confused in the 1-2 hours after the fall. She currently complains of right sided and posterior head pain, neck pain, back pain, and left knee pain that is worse with movement. She was given Tylenol yesterday. She notes associated left eye blurriness. She denies nausea, vomiting, bowel/bladder incontinence. She denies prior back surgery or history of cancer. She denies IV drug use.   History reviewed. No pertinent past medical history.  There are no active problems to display for this patient.   History reviewed. No pertinent surgical history.  OB History    No data available       Home Medications    Prior to Admission medications   Not on File    Family History No family history on file.  Social History Social History  Substance Use Topics  . Smoking status: Current Every Day Smoker  . Smokeless tobacco: Never Used  . Alcohol use Not on file     Allergies   Watermelon flavor   Review of Systems Review of Systems  Eyes:  Positive for visual disturbance (blurry).  Respiratory: Negative for shortness of breath.   Cardiovascular: Negative for chest pain.  Gastrointestinal: Negative for nausea and vomiting.  Musculoskeletal: Positive for arthralgias, back pain and neck pain.  Neurological: Positive for headaches.     Physical Exam Updated Vital Signs BP 110/71   Pulse (!) 54   Temp 98.5 F (36.9 C) (Oral)   Resp 18   LMP 07/13/2016 Comment: neg preg test today  SpO2 100%   Physical Exam  Constitutional: She is oriented to person, place, and time. She appears well-developed and well-nourished. No distress.  HENT:  Head: Normocephalic.  Nose: Nose normal.  Eyes: Conjunctivae and EOM are normal. Pupils are equal, round, and reactive to light. Left eye exhibits no discharge. No scleral icterus.  Neck: Normal range of motion. Neck supple.  Cardiovascular: Normal rate, regular rhythm, normal heart sounds and intact distal pulses.  Exam reveals no gallop and no friction rub.   No murmur heard. Pulmonary/Chest: Effort normal and breath sounds normal. No respiratory distress.  Abdominal: Soft. Bowel sounds are normal. She exhibits no distension. There is no tenderness. There is no guarding.  Musculoskeletal: Normal range of motion. She exhibits tenderness. She exhibits no edema.  Midline C spine and L spine tenderness.  Swelling of the left knee with tenderness to palpation at the medial side of the patella. Full active and passive ROM.  No step-off palpated. No visible bruising, edema or  temperature change noted. No objective signs of numbness present. No saddle anesthesia. 2+ DP pulses bilaterally. Sensation intact to light touch.    Neurological: She is alert and oriented to person, place, and time. No cranial nerve deficit. She exhibits normal muscle tone. Coordination normal.  CN appear grossly intact. Patient is alert and oriented to person, place, time and events. No visible bruising or wounds to back  of head.  Skin: Skin is warm and dry. No rash noted.  Psychiatric: She has a normal mood and affect.  Nursing note and vitals reviewed.    ED Treatments / Results  DIAGNOSTIC STUDIES: Oxygen Saturation is 99% on RA, normal by my interpretation.    COORDINATION OF CARE: 1:17 PM Discussed treatment plan with pt at bedside and pt agreed to plan.   Labs (all labs ordered are listed, but only abnormal results are displayed) Labs Reviewed  PREGNANCY, URINE    EKG  EKG Interpretation None       Radiology Dg Lumbar Spine Complete  Result Date: 07/19/2016 CLINICAL DATA:  Mid to low back pain after falling at work yesterday. EXAM: LUMBAR SPINE - COMPLETE 4+ VIEW COMPARISON:  None. FINDINGS: There is no evidence of lumbar spine fracture. Alignment is normal. Intervertebral disc spaces are maintained. IMPRESSION: Negative. Electronically Signed   By: Kennith CenterEric  Mansell M.D.   On: 07/19/2016 15:45   Ct Head Wo Contrast  Result Date: 07/19/2016 CLINICAL DATA:  Fall yesterday with right-sided neck pain and headache. EXAM: CT HEAD WITHOUT CONTRAST CT CERVICAL SPINE WITHOUT CONTRAST TECHNIQUE: Multidetector CT imaging of the head and cervical spine was performed following the standard protocol without intravenous contrast. Multiplanar CT image reconstructions of the cervical spine were also generated. COMPARISON:  None. FINDINGS: CT HEAD FINDINGS Brain: No evidence of acute infarction, hemorrhage, hydrocephalus, extra-axial collection or mass lesion/mass effect. Vascular: No hyperdense vessel or unexpected calcification. Skull: Normal. Negative for fracture or focal lesion. Sinuses/Orbits: No acute finding. Other: None. CT CERVICAL SPINE FINDINGS Alignment: Within normal. Skull base and vertebrae: No acute fracture. No primary bone lesion or focal pathologic process. Soft tissues and spinal canal: No prevertebral fluid or swelling. No visible canal hematoma. Disc levels:  Within normal. Upper chest:  Negative. Other: None. IMPRESSION: No acute intracranial findings. No acute cervical spine injury. Electronically Signed   By: Elberta Fortisaniel  Boyle M.D.   On: 07/19/2016 13:53   Ct Cervical Spine Wo Contrast  Result Date: 07/19/2016 CLINICAL DATA:  Fall yesterday with right-sided neck pain and headache. EXAM: CT HEAD WITHOUT CONTRAST CT CERVICAL SPINE WITHOUT CONTRAST TECHNIQUE: Multidetector CT imaging of the head and cervical spine was performed following the standard protocol without intravenous contrast. Multiplanar CT image reconstructions of the cervical spine were also generated. COMPARISON:  None. FINDINGS: CT HEAD FINDINGS Brain: No evidence of acute infarction, hemorrhage, hydrocephalus, extra-axial collection or mass lesion/mass effect. Vascular: No hyperdense vessel or unexpected calcification. Skull: Normal. Negative for fracture or focal lesion. Sinuses/Orbits: No acute finding. Other: None. CT CERVICAL SPINE FINDINGS Alignment: Within normal. Skull base and vertebrae: No acute fracture. No primary bone lesion or focal pathologic process. Soft tissues and spinal canal: No prevertebral fluid or swelling. No visible canal hematoma. Disc levels:  Within normal. Upper chest: Negative. Other: None. IMPRESSION: No acute intracranial findings. No acute cervical spine injury. Electronically Signed   By: Elberta Fortisaniel  Boyle M.D.   On: 07/19/2016 13:53   Dg Knee Complete 4 Views Left  Result Date: 07/19/2016 CLINICAL DATA:  Left knee  pain due to a fall yesterday. Initial encounter. EXAM: LEFT KNEE - COMPLETE 4+ VIEW COMPARISON:  None. FINDINGS: No evidence of fracture, dislocation, or joint effusion. No evidence of arthropathy or other focal bone abnormality. Soft tissues are unremarkable. IMPRESSION: Normal exam. Electronically Signed   By: Drusilla Kanner M.D.   On: 07/19/2016 15:52    Procedures Procedures (including critical care time)  Medications Ordered in ED Medications - No data to display   Initial  Impression / Assessment and Plan / ED Course  I have reviewed the triage vital signs and the nursing notes.  Pertinent labs & imaging results that were available during my care of the patient were reviewed by me and considered in my medical decision making (see chart for details).     Patient presents to ED for headache, neck pain, back pain and knee pain that began after mechanical fall yesterday at work. She is unsure if she lost consciousness but her family member states that she did, and remained in and out of consciousness for the next 2-3 hours. Reports no improvement in pain with Tylenol which she received after the fall. On physical exam she has no focal deficits on neurological exam. She is alert and oriented. There is mild edema of the left knee and some tenderness to palpation over the lateral side of the patella. She is able to ambulate normally. She denies any numbness, weakness, urinary incontinence, history of IV drug use, history of back surgery or history of cancer. There is tenderness to palpation present of the lumbar spine and C-spine. No step-off or visible deformity noted. Low suspicion for cauda equina or other acute spinal cord abnormality. Low likelihood of intracranial abnormality but will obtain CT head due to h/o LOC. CT of head and C-spine negative. X-rays of L-spine and knee returned as negative for fracture dislocation versus effusion. Patient reports improvement in symptoms here in the ED with rest. She is ambulatory here. I advised patient to take Tylenol or ibuprofen as needed for pain. We'll give knee sleeve here in the ED for support. Advised her to follow up with her PCP for further evaluation. Patient appears stable for discharge at this time. Strict return precautions given.  Final Clinical Impressions(s) / ED Diagnoses   Final diagnoses:  Fall, initial encounter  Acute pain of left knee    New Prescriptions New Prescriptions   No medications on file   I  personally performed the services described in this documentation, which was scribed in my presence. The recorded information has been reviewed and is accurate.    Dietrich Pates, PA-C 07/19/16 1616    Doug Sou, MD 07/19/16 [phone]

## 2016-09-09 ENCOUNTER — Encounter (HOSPITAL_COMMUNITY): Payer: Self-pay

## 2016-09-09 DIAGNOSIS — R197 Diarrhea, unspecified: Secondary | ICD-10-CM | POA: Insufficient documentation

## 2016-09-09 DIAGNOSIS — F17200 Nicotine dependence, unspecified, uncomplicated: Secondary | ICD-10-CM | POA: Insufficient documentation

## 2016-09-09 DIAGNOSIS — R112 Nausea with vomiting, unspecified: Secondary | ICD-10-CM | POA: Insufficient documentation

## 2016-09-09 DIAGNOSIS — Z87891 Personal history of nicotine dependence: Secondary | ICD-10-CM | POA: Insufficient documentation

## 2016-09-09 LAB — URINALYSIS, ROUTINE W REFLEX MICROSCOPIC
BACTERIA UA: NONE SEEN
BILIRUBIN URINE: NEGATIVE
Bilirubin Urine: NEGATIVE
GLUCOSE, UA: NEGATIVE mg/dL
Glucose, UA: NEGATIVE mg/dL
Hgb urine dipstick: NEGATIVE
Hgb urine dipstick: NEGATIVE
KETONES UR: NEGATIVE mg/dL
KETONES UR: NEGATIVE mg/dL
LEUKOCYTES UA: NEGATIVE
Leukocytes, UA: NEGATIVE
Nitrite: NEGATIVE
Nitrite: NEGATIVE
PROTEIN: 30 mg/dL — AB
PROTEIN: NEGATIVE mg/dL
Specific Gravity, Urine: 1.015 (ref 1.005–1.030)
Specific Gravity, Urine: 1.02 (ref 1.005–1.030)
pH: 7 (ref 5.0–8.0)
pH: 7 (ref 5.0–8.0)

## 2016-09-09 LAB — CBC
HEMATOCRIT: 37.5 % (ref 36.0–46.0)
HEMATOCRIT: 39 % (ref 36.0–46.0)
HEMOGLOBIN: 12.1 g/dL (ref 12.0–15.0)
Hemoglobin: 13 g/dL (ref 12.0–15.0)
MCH: 31.3 pg (ref 26.0–34.0)
MCH: 32.5 pg (ref 26.0–34.0)
MCHC: 32.3 g/dL (ref 30.0–36.0)
MCHC: 33.3 g/dL (ref 30.0–36.0)
MCV: 97.2 fL (ref 78.0–100.0)
MCV: 97.5 fL (ref 78.0–100.0)
Platelets: 245 10*3/uL (ref 150–400)
Platelets: 246 10*3/uL (ref 150–400)
RBC: 3.86 MIL/uL — AB (ref 3.87–5.11)
RBC: 4 MIL/uL (ref 3.87–5.11)
RDW: 13 % (ref 11.5–15.5)
RDW: 13.5 % (ref 11.5–15.5)
WBC: 8 10*3/uL (ref 4.0–10.5)
WBC: 5.7 10*3/uL (ref 4.0–10.5)

## 2016-09-09 LAB — COMPREHENSIVE METABOLIC PANEL
ALBUMIN: 4 g/dL (ref 3.5–5.0)
ALK PHOS: 53 U/L (ref 38–126)
ALT: 17 U/L (ref 14–54)
ALT: 14 U/L (ref 14–54)
ANION GAP: 4 — AB (ref 5–15)
AST: 23 U/L (ref 15–41)
AST: 20 U/L (ref 15–41)
Albumin: 4.2 g/dL (ref 3.5–5.0)
Alkaline Phosphatase: 61 U/L (ref 38–126)
Anion gap: 9 (ref 5–15)
BUN: 7 mg/dL (ref 6–20)
BUN: 12 mg/dL (ref 6–20)
CALCIUM: 9.4 mg/dL (ref 8.9–10.3)
CHLORIDE: 103 mmol/L (ref 101–111)
CHLORIDE: 107 mmol/L (ref 101–111)
CO2: 25 mmol/L (ref 22–32)
CO2: 30 mmol/L (ref 22–32)
CREATININE: 0.58 mg/dL (ref 0.44–1.00)
Calcium: 9.6 mg/dL (ref 8.9–10.3)
Creatinine, Ser: 0.61 mg/dL (ref 0.44–1.00)
GFR calc non Af Amer: >60 mL/min (ref >60)
GLUCOSE: 104 mg/dL — AB (ref 65–99)
Glucose, Bld: 78 mg/dL (ref 65–99)
POTASSIUM: 3.4 mmol/L — AB (ref 3.5–5.1)
POTASSIUM: 3.7 mmol/L (ref 3.5–5.1)
SODIUM: 137 mmol/L (ref 135–145)
SODIUM: 141 mmol/L (ref 135–145)
Total Bilirubin: 0.5 mg/dL (ref 0.3–1.2)
Total Bilirubin: 0.5 mg/dL (ref 0.3–1.2)
Total Protein: 8.3 g/dL — ABNORMAL HIGH (ref 6.5–8.1)
Total Protein: 7.9 g/dL (ref 6.5–8.1)

## 2016-09-09 LAB — LIPASE, BLOOD
LIPASE: 25 U/L (ref 11–51)
LIPASE: 95 U/L — AB (ref 11–51)

## 2016-09-09 NOTE — ED Triage Notes (Signed)
Pt presents with c/o vomiting and abdominal pain that started this morning around 3 am. Pt also c/o diarrhea.

## 2016-09-09 NOTE — ED Notes (Signed)
Emesis started last night 2-3 hrs after eating out, has continued into today, but has lessened.

## 2016-09-09 NOTE — ED Triage Notes (Signed)
Pt presents with c/o nausea, vomiting, and abdominal pain. Pt reports that her and her sister ate at the same place last night and have both been sick since this morning around 2 am.

## 2016-09-09 NOTE — ED Notes (Signed)
Pt stated emesis a couple hours after eating last night.

## 2016-09-10 ENCOUNTER — Emergency Department (HOSPITAL_COMMUNITY)
Admission: EM | Admit: 2016-09-10 | Discharge: 2016-09-10 | Disposition: A | Discharge status: Home / Self Care | Payer: Medicaid Other | Attending: Emergency Medicine | Admitting: Emergency Medicine

## 2016-09-10 ENCOUNTER — Emergency Department (HOSPITAL_COMMUNITY)
Admission: EM | Admit: 2016-09-10 | Discharge: 2016-09-10 | Disposition: A | Discharge status: Home / Self Care | Payer: Self-pay | Attending: Emergency Medicine | Admitting: Emergency Medicine

## 2016-09-10 DIAGNOSIS — R112 Nausea with vomiting, unspecified: Secondary | ICD-10-CM

## 2016-09-10 DIAGNOSIS — R197 Diarrhea, unspecified: Secondary | ICD-10-CM

## 2016-09-10 LAB — PREGNANCY, URINE
Preg Test, Ur: NEGATIVE
Preg Test, Ur: NEGATIVE

## 2016-09-10 MED ORDER — ONDANSETRON 4 MG PO TBDP
4.0000 mg | ORAL_TABLET | Freq: Once | ORAL | Status: AC
  Administered 2016-09-10: 4 mg via ORAL
  Filled 2016-09-10: qty 1

## 2016-09-10 MED ORDER — ONDANSETRON 4 MG PO TBDP: 4.0000 mg | ORAL_TABLET | Freq: Three times a day (TID) | ORAL | 0 refills | Status: DC | PRN

## 2016-09-10 MED ORDER — ACETAMINOPHEN 325 MG PO TABS
650.0000 mg | ORAL_TABLET | Freq: Once | ORAL | Status: AC
  Administered 2016-09-10: 650 mg via ORAL
  Filled 2016-09-10: qty 2

## 2016-09-10 MED ORDER — GI COCKTAIL ~~LOC~~
30.0000 mL | Freq: Once | ORAL | Status: AC
  Administered 2016-09-10: 30 mL via ORAL
  Filled 2016-09-10: qty 30

## 2016-09-10 MED ORDER — ONDANSETRON HCL 4 MG PO TABS: 4.0000 mg | ORAL_TABLET | Freq: Four times a day (QID) | ORAL | 0 refills | Status: DC

## 2016-09-10 MED ORDER — ONDANSETRON HCL 4 MG PO TABS
4.0000 mg | ORAL_TABLET | Freq: Once | ORAL | Status: AC
  Administered 2016-09-10: 4 mg via ORAL
  Filled 2016-09-10: qty 1

## 2016-09-10 NOTE — ED Provider Notes (Signed)
21 y.o.  female received at sign out from [place] pending fluid challenge.  HPI per Margarette Asal:   "Katelyn Patel is a 21 y.o. female presents to the ED for evaluation of sudden onset nausea, non bilious non bloody emesis x 15, non bloody diarrhea x 5 and diffuse abdominal cramping onset 1 hour after eating [employer]. States her sister who also ate [place] [employer] has same symptoms. Denies fevers, chest pain, shortness of breath, dysuria. Has 2 shots of liquor before [employer]. Denies recent travel, new medications. No recent sea food. No recent outdoor exposure or drinking of unfiltered water.states she attempted to eat [employer] 30 minutes the ago and threw it back up."  Physical Exam  BP 115/63   Pulse 85   Temp 98 F (36.7 C) (Oral)   Resp 16   Ht 5\' 7"  (1.702 m)   Wt 79.4 kg (175 lb)   LMP 09/09/2016 (Approximate)   SpO2 100%   BMI 27.41 kg/m   Physical Exam  ED Course  Procedures  MDM Zofran administered in the ED. Patient successfully fluid challenged. Will d/c the patient to home with a prescription of zofran at this time. Strict return precautions given. NAD/ VSS. The patient is safe for d/c at this time.        Barkley Boards, PA-C 09/11/16 1750    Palumbo, April, MD 09/28/16 0030

## 2016-09-10 NOTE — ED Provider Notes (Signed)
[facility]-EMERGENCY DEPT Provider Note   CSN: [id number] Arrival date & time: 09/09/16  1843     History   Chief Complaint Chief Complaint  Patient presents with  . Emesis    HPI Katelyn Patel is a 21 y.o. female presents to the ED for evaluation of sudden onset nausea, non bilious non bloody emesis x 15, non bloody diarrhea x 5 and diffuse abdominal cramping onset 1 hour after eating [employer]. States her sister who also ate [place] [employer] has same symptoms. Denies fevers, chest pain, shortness of breath, dysuria. Has 2 shots of liquor before [place] [employer]. Denies recent travel, new medications. No recent sea food. No recent outdoor exposure or drinking of unfiltered water.states she attempted to eat [employer] 30 minutes the ago and threw it back up.  HPI  History reviewed. No pertinent past medical history.  There are no active problems to display for this patient.   History reviewed. No pertinent surgical history.  OB History    No data available       Home Medications    Prior to Admission medications   Not on File    Family History History reviewed. No pertinent family history.  Social History Social History  Substance Use Topics  . Smoking status: Current Every Day Smoker  . Smokeless tobacco: Never Used  . Alcohol use Not on file     Allergies   Watermelon flavor   Review of Systems Review of Systems  Constitutional: Negative for fever.  HENT: Negative for congestion and sore throat.   Respiratory: Negative for cough and shortness of breath.   Cardiovascular: Negative for chest pain.  Gastrointestinal: Positive for abdominal pain, diarrhea, nausea and vomiting. Negative for blood in stool and constipation.  Genitourinary: Negative for difficulty urinating, dysuria and hematuria.  Musculoskeletal: Positive for back pain and myalgias.  Skin: Negative for wound.  Neurological: Negative for light-headedness.     Physical Exam Updated Vital Signs BP 120/69  (BP Location: Left Arm)   Pulse 75   Temp 98 F (36.7 C) (Oral)   Resp 18   Ht 5\' 7"  (1.702 m)   Wt 79.4 kg (175 lb)   LMP 09/09/2016 (Approximate)   SpO2 100%   BMI 27.41 kg/m   Physical Exam  Constitutional: She is oriented to person, place, and time. She appears well-developed and well-nourished. No distress.  Nontoxic appearing  HENT:  Head: Normocephalic and atraumatic.  Nose: Nose normal.  Mouth/Throat: No oropharyngeal exudate.  Moist mucous membranes  Eyes: Pupils are equal, round, and reactive to light. Conjunctivae and EOM are normal.  Neck: Normal range of motion. Neck supple.  Cardiovascular: Normal rate, regular rhythm, normal heart sounds and intact distal pulses.   No murmur heard. Pulmonary/Chest: Effort normal and breath sounds normal. No respiratory distress. She has no wheezes. She has no rales. She exhibits no tenderness.  Abdominal: Soft. Bowel sounds are normal. She exhibits no distension and no mass. There is tenderness. There is no rebound and no guarding.  Mild diffuse abdominal tenderness most significant at the periumbilical region No suprapubic or CVA tenderness Negative Murphy's and McBurney's  Musculoskeletal: Normal range of motion. She exhibits no deformity.  Lymphadenopathy:    She has no cervical adenopathy.  Neurological: She is alert and oriented to person, place, and time. No sensory deficit.  Skin: Skin is warm and dry. Capillary refill takes less than 2 seconds.  Psychiatric: She has a normal mood and affect. Her behavior is normal. Judgment  and thought content normal.  Nursing note and vitals reviewed.   QTC is ED Treatments / Results  Labs (all labs ordered are listed, but only abnormal results are displayed) Labs Reviewed  LIPASE, BLOOD - Abnormal; Notable for the following:       Result Value   Lipase 95 (*)    All other components within normal limits  COMPREHENSIVE METABOLIC PANEL - Abnormal; Notable for the following:     Total Protein 8.3 (*)    All other components within normal limits  URINALYSIS, ROUTINE W REFLEX MICROSCOPIC - Abnormal; Notable for the following:    Squamous Epithelial / LPF 0-5 (*)    All other components within normal limits  CBC  PREGNANCY, URINE    EKG  EKG Interpretation None       Radiology No results found.  Procedures Procedures (including critical care time)  Medications Ordered in ED Medications  ondansetron (ZOFRAN) tablet 4 mg (not administered)  ondansetron (ZOFRAN-ODT) disintegrating tablet 4 mg (4 mg Oral Given 09/10/16 8250)     Initial Impression / Assessment and Plan / ED Course  I have reviewed the triage vital signs and the nursing notes.  Pertinent labs & imaging results that were available during my care of the patient were reviewed by me and considered in my medical decision making (see chart for details).  Clinical Course as of Sep 10 713  Wynelle Link Sep 10, 2016  0370 Reevaluated patient after Zofran and fluid challenge. States she was able to keep some water down but the taste of Zofran ODT made her throw up. She reports mild nausea, plan to reassess and repeat abdominal exam.   [CG]  4888 Re-evaluated pt. She was asleep. States nausea has resolved. Has been sipping on fluids without vomiting since last re-eval. Abdomen remains soft and only mildly tender.  [CG]    Clinical Course User Index [CG] Liberty Handy, PA-C   21 year old female presents with nausea, vomiting, diarrhea and abdominal pain after eating cookout. Her sister who ate the same thing also developed the same symptoms within 1 hour of ingestion. Had 2 shots of liquor. No red flag symptoms to raise suspicion for emergent abdominal process including fever, bloody or bilious emesis, bloody diarrhea, dysuria, vaginal bleeding. No exposure to sea food, recent travel or exposure to unfiltered water. No history of heavy EtOH or NSAID use. No abdominal surgeries.  Exam reassuring. VS WNL.  Abdomen soft w/o rigidity or rebound, only mild periumbilical tenderness with deep palpation. No suprapubic or CVA tenderness. No signs of severe dehydration.   CBC, CMP, lipase, and urine preg reassuring. Lipase only mildly elevated. Given hx, lack of red flag symptoms and benign exam and ED work up suspect gastroenteritis. Doubt pancreatitis, cholecystitis, appendicitis, SBO. Will give zofran odt and fluid challenge and reassess.   Final Clinical Impressions(s) / ED Diagnoses   Final diagnoses:  Nausea vomiting and diarrhea   Re-evaluation is reassuring. She has tolerated small sips of fluids. Will d/c home with anti-emetics and gentle fluid hydration. Discussed s/s that would warrant return to ED.   New Prescriptions New Prescriptions   No medications on file     Jerrell Mylar 09/10/16 0715    Palumbo, April, MD 09/10/16 [phone]

## 2016-09-10 NOTE — ED Notes (Signed)
PT WAS UNABLE TO KEEP THE LIQUID DOWN

## 2016-09-10 NOTE — Discharge Instructions (Signed)
You were evaluated in the ED for nausea, vomiting, diarrhea. I suspect E have food poisoning. Your lab work was reassuring. You were given nausea medication and oral hydration.  Please take Zofran for nausea as needed. And sure you drink small amounts of fluid throughout the day to stay hydrated. Avoid heavy, greasy and spicy foods.  Your symptoms should improve in the next 24-48 hours. Return to the ED for persistent nausea, vomiting, abdominal pain and diarrhea, fevers or inability to keep fluids/food down.

## 2016-09-10 NOTE — ED Notes (Signed)
PT IS UNABLE TO VOID AT THIS TIME.

## 2016-09-10 NOTE — ED Notes (Signed)
Pt reports she threw up initially after being given zofran, but then was able to tolerate small sips PO.

## 2016-09-10 NOTE — ED Provider Notes (Signed)
Medical screening examination/treatment/procedure(s) were performed by non-physician practitioner and as supervising physician I was immediately available for consultation/collaboration.   EKG Interpretation None        Brylin Stanislawski, MD 09/10/16 1224

## 2016-09-10 NOTE — ED Provider Notes (Signed)
[facility] Provider Note   CSN: [id number] Arrival date & time: 09/09/16  1846     History   Chief Complaint Chief Complaint  Patient presents with  . Emesis    HPI Ashley Kent is a 21 y.o. female who presents to the emergency department with a chief complaint of vomiting with associated nausea and abdominal pain. She she reports that she went to cookout with her sister approximately 28 hours ago and ordered a hotdog, which she shared with her sister. She reports shortly after eating they both developed nausea and non-bilious, non-bloody vomiting with generalized abdominal pain. She denies fever, chills, diarrhea, dysuria, frequency, back pain, or vaginal pain, itching, or discharge. No treatment prior to arrival. She reports multiple episodes of vomiting since onset, which improved overnight, but the nausea persisted. Last emesis around 3 AM.   No daily medications.  She reports a history of mild hypokalemia, but does not take supplementation. No other pertinent past medical history.  The history is provided by the patient. No language interpreter was used.    Past Medical History:  Diagnosis Date  . Anxiety   . Depression     Patient Active Problem List   Diagnosis Date Noted  . 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 No pertinent surgical history.  OB History    Gravida Para Term Preterm AB Living   0             SAB TAB Ectopic Multiple Live Births                   Home Medications    Prior to Admission medications   Medication Sig Start Date End Date Taking? Authorizing Provider  ondansetron (ZOFRAN ODT) 4 MG disintegrating tablet Take 1 tablet (4 mg total) by mouth every 8 (eight) hours as needed for nausea or vomiting. 09/10/16   Jabree Rebert A, PA-C  potassium chloride (K-DUR) 10 MEQ tablet Take 1 tablet (10 mEq total) by mouth 2 (two) times daily. Patient not taking: Reported  on 09/10/2016 11/07/15 11/09/15  Alvira Monday, MD    Family History Family History  Problem Relation Age of Onset  . Stroke Other   . Alcoholism Maternal Uncle     Social History Social History  Substance Use Topics  . Smoking status: Former Games developer  . Smokeless tobacco: Never Used  . Alcohol use No     Comment: occ     Allergies   Orange (diagnostic); Sunscreens; and Zyrtec [cetirizine]   Review of Systems Review of Systems  Constitutional: Negative for activity change, chills and fever.  Respiratory: Negative for shortness of breath.   Cardiovascular: Negative for chest pain.  Gastrointestinal: Positive for abdominal pain, nausea and vomiting. Negative for diarrhea.  Genitourinary: Negative for dysuria, frequency, vaginal bleeding, vaginal discharge and vaginal pain.  Musculoskeletal: Negative for back pain.  Skin: Negative for rash.  Allergic/Immunologic: Negative for immunocompromised state.   Physical Exam Updated Vital Signs BP 100/71 (BP Location: Right Arm)   Pulse 69   Temp 97.9 F (36.6 C) (Oral)   Resp 14   Ht 5\' 5"  (1.651 m)   Wt 69.9 kg (154 lb)   LMP 09/02/2016 (Approximate)   SpO2 100%   BMI 25.63 kg/m   Physical Exam  Constitutional: She appears well-developed and well-nourished. No distress.  HENT:  Head: Normocephalic.  Moist mucous membranes.  Eyes: Conjunctivae are normal.  Neck: Neck supple.  Cardiovascular: Normal rate, regular rhythm and normal heart sounds.  Exam reveals no gallop and no friction rub.   No murmur heard. Pulmonary/Chest: Effort normal. No respiratory distress. She has no wheezes. She has no rales. She exhibits no tenderness.  Abdominal: Soft. Bowel sounds are normal. She exhibits no distension and no mass. There is tenderness. There is no rebound and no guarding. No hernia.  Mild diffuse abdominal tenderness. No focal tenderness. No CVA tenderness bilaterally.  Musculoskeletal: Normal range of motion. She exhibits  no edema, tenderness or deformity.  Neurological: She is alert.  Skin: Skin is warm. Capillary refill takes less than 2 seconds. No rash noted. She is not diaphoretic.  Psychiatric: Her behavior is normal.  Nursing note and vitals reviewed.  ED Treatments / Results  Labs (all labs ordered are listed, but only abnormal results are displayed) Labs Reviewed  COMPREHENSIVE METABOLIC PANEL - Abnormal; Notable for the following:       Result Value   Potassium 3.4 (*)    Glucose, Bld 104 (*)    Anion gap 4 (*)    All other components within normal limits  CBC - Abnormal; Notable for the following:    RBC 3.86 (*)    All other components within normal limits  URINALYSIS, ROUTINE W REFLEX MICROSCOPIC - Abnormal; Notable for the following:    APPearance HAZY (*)    Protein, ur 30 (*)    Bacteria, UA RARE (*)    Squamous Epithelial / LPF 6-30 (*)    All other components within normal limits  LIPASE, BLOOD  PREGNANCY, URINE    EKG  EKG Interpretation None       Radiology No results found.  Procedures Procedures (including critical care time)  Medications Ordered in ED Medications  ondansetron (ZOFRAN-ODT) disintegrating tablet 4 mg (4 mg Oral Given 09/10/16 0722)  gi cocktail (Maalox,Lidocaine,Donnatal) (30 mLs Oral Given 09/10/16 0806)  acetaminophen (TYLENOL) tablet 650 mg (650 mg Oral Given 09/10/16 1610)     Initial Impression / Assessment and Plan / ED Course  I have reviewed the triage vital signs and the nursing notes.  Pertinent labs & imaging results that were available during my care of the patient were reviewed by me and considered in my medical decision making (see chart for details).     21 year old female presenting with nausea, vomiting and abdominal pain x28 hours. Last emesis was 3 hours prior to evaluation. Likely food poisoning since her sister has been ill with the same symptoms during the same timeframe and the two shared the same meal prior to symptom  onset. Patient is nontoxic, nonseptic appearing, in no apparent distress.  Patient's pain and other symptoms adequately managed in emergency department. Labs, imaging and vitals reviewed.  Patient does not meet the SIRS or Sepsis criteria.  On repeat exam patient does not have a surgical abdomen and there are no peritoneal signs.  No indication of appendicitis, bowel obstruction, bowel perforation, cholecystitis, diverticulitis, PID or ectopic pregnancy.  Patient discharged home with symptomatic treatment and given strict instructions for follow-up with their primary care physician.  I have also discussed reasons to return immediately to the ER.  Patient expresses understanding and agrees with plan.     Final Clinical Impressions(s) / ED Diagnoses   Final diagnoses:  Nausea and vomiting in adult    New Prescriptions Discharge Medication List as of 09/10/2016  8:59 AM       Frederik Pear A, PA-C 09/11/16 1747  Palumbo, April, MD 09/28/16 0030

## 2016-09-10 NOTE — ED Notes (Signed)
She is drinking water--she declines any other liquids offered. She has had no emeses since Zofran given.

## 2016-09-10 NOTE — Discharge Instructions (Signed)
You may take Zofran once every 8 hours for nausea and vomiting. I have attached information on a bland diet, which may help with her abdominal pain until your symptoms resolve.  If you develop fever, chills, or if you vomit blood or have lime green vomiting, if you are unable to keep down fluids despite taking Zofran or if you develop new symptoms, please return to the emergency department for reevaluation.  You can call the number on her discharge paperwork to get established with a primary care provider.

## 2016-12-06 ENCOUNTER — Ambulatory Visit: Payer: Self-pay | Admitting: Urgent Care

## 2017-05-21 ENCOUNTER — Ambulatory Visit (HOSPITAL_COMMUNITY)
Admission: EM | Admit: 2017-05-21 | Discharge: 2017-05-21 | Disposition: A | Discharge status: Home / Self Care | Payer: Self-pay | Attending: Family Medicine | Admitting: Family Medicine

## 2017-05-21 ENCOUNTER — Encounter (HOSPITAL_COMMUNITY): Payer: Self-pay | Admitting: Family Medicine

## 2017-05-21 DIAGNOSIS — Z7251 High risk heterosexual behavior: Secondary | ICD-10-CM

## 2017-05-21 DIAGNOSIS — R112 Nausea with vomiting, unspecified: Secondary | ICD-10-CM

## 2017-05-21 MED ORDER — METOCLOPRAMIDE HCL 10 MG PO TABS: 10.0000 mg | ORAL_TABLET | Freq: Four times a day (QID) | ORAL | 0 refills | Status: DC | PRN

## 2017-05-21 NOTE — ED Provider Notes (Signed)
[facility]    CSN: [id number] Arrival date & time: 05/21/17  1544     History   Chief Complaint Chief Complaint  Patient presents with  . Emesis    HPI Katelyn Patel is a 22 y.o. female.   Complains of vomiting this morning.  She denies a precipitating event, or eating anything unusual.  States no one around her has similar symptoms.  She has vomiting 3-4 times.  Reports her vomit is a milky white.  She has not tried OTC medications. Her symptom are made worst with eating.  She denies fever, chills, nausea, abdominal pain, diarrhea, melena, hematochezia.  Patient requests STI testing.  Is currently asymptomatic, but admits to unprotected sexual intercourse.       History reviewed. No pertinent past medical history.  There are no active problems to display for this patient.   History reviewed. No pertinent surgical history.  OB History   None      Home Medications    Prior to Admission medications   Medication Sig Start Date End Date Taking? Authorizing Provider  metoCLOPramide (REGLAN) 10 MG tablet Take 1 tablet (10 mg total) by mouth every 6 (six) hours as needed for nausea or vomiting. 05/21/17   Rennis Harding, PA-C    Family History History reviewed. No pertinent family history.  Social History Social History   Tobacco Use  . Smoking status: Current Every Day Smoker  . Smokeless tobacco: Never Used  Substance Use Topics  . Alcohol use: Not on file  . Drug use: Not on file     Allergies   Watermelon flavor   Review of Systems Review of Systems  Constitutional: Negative for chills and fever.  Respiratory: Negative for shortness of breath.   Cardiovascular: Negative for chest pain.  Gastrointestinal: Positive for nausea and vomiting. Negative for abdominal pain, blood in stool, constipation and diarrhea.  Genitourinary: Negative for difficulty urinating, dysuria, genital sores, hematuria, pelvic pain, vaginal discharge and vaginal  pain.     Physical Exam Triage Vital Signs ED Triage Vitals  Enc Vitals Group     BP 05/21/17 1621 110/74     Pulse Rate 05/21/17 1621 71     Resp 05/21/17 1621 18     Temp 05/21/17 1621 98.1 F (36.7 C)     Temp src --      SpO2 05/21/17 1621 100 %     Weight --      Height --      Head Circumference --      Peak Flow --      Pain Score 05/21/17 1618 6     Pain Loc --      Pain Edu? --      Excl. in GC? --    No data found.  Updated Vital Signs BP 110/74   Pulse 71   Temp 98.1 F (36.7 C)   Resp 18   LMP 05/16/2017   SpO2 100%   Physical Exam  Constitutional: She is oriented to person, place, and time. She appears well-developed and well-nourished. No distress.  HENT:  Head: Normocephalic and atraumatic.  Right Ear: External ear normal.  Left Ear: External ear normal.  Nose: Nose normal.  Mouth/Throat: Oropharynx is clear and moist. No oropharyngeal exudate.  Eyes: Pupils are equal, round, and reactive to light. EOM are normal.  Neck: Normal range of motion. Neck supple.  Cardiovascular: Normal rate, regular rhythm and normal heart sounds. Exam reveals no gallop and no  friction rub.  No murmur heard. Radial pulse 2+ bilaterally    Pulmonary/Chest: Effort normal and breath sounds normal. No stridor. No respiratory distress. She has no wheezes. She has no rales.  Abdominal: Soft. Bowel sounds are normal. She exhibits no distension. There is no tenderness. There is no rebound and no guarding.  Lymphadenopathy:    She has no cervical adenopathy.  Neurological: She is alert and oriented to person, place, and time.  Skin: Skin is warm and dry. Capillary refill takes less than 2 seconds. She is not diaphoretic.  Psychiatric: She has a normal mood and affect. Her behavior is normal. Judgment and thought content normal.  Nursing note and vitals reviewed.    UC Treatments / Results  Labs (all labs ordered are listed, but only abnormal results are displayed) Labs  Reviewed  URINE CYTOLOGY ANCILLARY ONLY    EKG None  Radiology No results found.  Procedures Procedures (including critical care time)  Medications Ordered in UC Medications - No data to display  Initial Impression / Assessment and Plan / UC Course  I have reviewed the triage vital signs and the nursing notes.  Pertinent labs & imaging results that were available during my care of the patient were reviewed by me and considered in my medical decision making (see chart for details).     One day history of nausea and vomiting.  Reports three episodes of vomiting with associated nausea.  Declines zofran in office.  Declines UPT.  Prescribed metoclopramide.  Given diet instructions.  Will follow up with PCP if symptoms persists.    Patient also requests STI testing.  Currently asymptomatic.  Urine cytology sent for GC, chlamydia, BV, trich, and candidiasis.  Will follow up with patient regarding her results.  If positive, appropriate medications will be sent in.  Instructed patient to abstain from sex for 7 days and to notify partners if any of her results are positive.   Final Clinical Impressions(s) / UC Diagnoses   Final diagnoses:  Non-intractable vomiting with nausea, unspecified vomiting type  Unprotected sex     Discharge Instructions     Prescribed metoclopramide to take as needed for nausea and vomiting Follow up with PCP if symptoms persists  Diet Instructions: 30 minutes after taking nausea medicine, begin with sips of clear liquids. If able to hold down 2 - 4 ounces for 30 minutes, begin drinking more. Increase your fluid intake to replace losses. Clear liquids only for 24 hours (water, tea, sport drinks, clear flat ginger ale or cola and juices, broth, jello, popsicles, ect). Advance to bland foods, applesauce, rice, baked or boiled chicken, ect. Avoid milk, greasy foods and anything that doesn't agree with you.  If vomiting persists and you are unable to hold  fluids, or you experience any new or worsening symptoms, return here or go to the ER  Request STI testing We will follow up with you regarding the results of your test    ED Prescriptions    Medication Sig Dispense Auth. Provider   metoCLOPramide (REGLAN) 10 MG tablet Take 1 tablet (10 mg total) by mouth every 6 (six) hours as needed for nausea or vomiting. 30 tablet Alvino Chapel, [place], PA-C     Controlled Substance Prescriptions [place] Controlled Substance Registry consulted? Not Applicable   Rennis Harding, [place] 05/21/17 1746

## 2017-05-21 NOTE — ED Triage Notes (Signed)
Pt here for vomiting since this am.  

## 2017-05-21 NOTE — Discharge Instructions (Signed)
Prescribed metoclopramide to take as needed for nausea and vomiting Follow up with PCP if symptoms persists  Diet Instructions: 30 minutes after taking nausea medicine, begin with sips of clear liquids. If able to hold down 2 - 4 ounces for 30 minutes, begin drinking more. Increase your fluid intake to replace losses. Clear liquids only for 24 hours (water, tea, sport drinks, clear flat ginger ale or cola and juices, broth, jello, popsicles, ect). Advance to bland foods, applesauce, rice, baked or boiled chicken, ect. Avoid milk, greasy foods and anything that doesn?t agree with you.  If vomiting persists and you are unable to hold fluids, or you experience any new or worsening symptoms, return here or go to the ER  Request STI testing We will follow up with you regarding the results of your test

## 2017-06-20 ENCOUNTER — Encounter (HOSPITAL_COMMUNITY): Payer: Self-pay | Admitting: Emergency Medicine

## 2017-06-20 ENCOUNTER — Ambulatory Visit (HOSPITAL_COMMUNITY)
Admission: EM | Admit: 2017-06-20 | Discharge: 2017-06-20 | Disposition: A | Discharge status: Home / Self Care | Payer: Self-pay | Attending: Internal Medicine | Admitting: Internal Medicine

## 2017-06-20 ENCOUNTER — Other Ambulatory Visit: Payer: Self-pay

## 2017-06-20 DIAGNOSIS — R1110 Vomiting, unspecified: Secondary | ICD-10-CM

## 2017-06-20 DIAGNOSIS — R197 Diarrhea, unspecified: Secondary | ICD-10-CM

## 2017-06-20 DIAGNOSIS — Z3202 Encounter for pregnancy test, result negative: Secondary | ICD-10-CM

## 2017-06-20 DIAGNOSIS — R519 Headache, unspecified: Secondary | ICD-10-CM

## 2017-06-20 DIAGNOSIS — R51 Headache: Secondary | ICD-10-CM

## 2017-06-20 DIAGNOSIS — R109 Unspecified abdominal pain: Secondary | ICD-10-CM

## 2017-06-20 LAB — POCT PREGNANCY, URINE: PREG TEST UR: NEGATIVE

## 2017-06-20 MED ORDER — ONDANSETRON 4 MG PO TBDP
ORAL_TABLET | ORAL | Status: AC
  Filled 2017-06-20: qty 1

## 2017-06-20 MED ORDER — ONDANSETRON 4 MG PO TBDP
4.0000 mg | ORAL_TABLET | Freq: Once | ORAL | Status: AC
  Administered 2017-06-20: 4 mg via ORAL

## 2017-06-20 MED ORDER — ONDANSETRON 4 MG PO TBDP: 4.0000 mg | ORAL_TABLET | Freq: Three times a day (TID) | ORAL | 0 refills | Status: DC | PRN

## 2017-06-20 MED ORDER — KETOROLAC TROMETHAMINE 60 MG/2ML IM SOLN
INTRAMUSCULAR | Status: AC
  Filled 2017-06-20: qty 2

## 2017-06-20 MED ORDER — DICYCLOMINE HCL 20 MG PO TABS: 20.0000 mg | ORAL_TABLET | Freq: Two times a day (BID) | ORAL | 0 refills | Status: DC

## 2017-06-20 MED ORDER — KETOROLAC TROMETHAMINE 60 MG/2ML IM SOLN
60.0000 mg | Freq: Once | INTRAMUSCULAR | Status: AC
  Administered 2017-06-20: 60 mg via INTRAMUSCULAR

## 2017-06-20 NOTE — Discharge Instructions (Signed)
Push fluids to ensure adequate hydration and keep secretions thin.  Tylenol as needed for pain. Bentyl 2-3 times a day for stomach cramping. Zofran as needed for nausea. If persistent pain, cramping or vomiting or if no improvement or worsening in the next 2-3 days please go to Er for further evaluation.

## 2017-06-20 NOTE — ED Triage Notes (Addendum)
C/o headache onset a week ago.  Patient reports nausea and vomiting and diarrhea.  Intermittent abdominal cramping  Frequently yawning at intake

## 2017-06-20 NOTE — ED Provider Notes (Signed)
[facility]    CSN: [id number] Arrival date & time: 06/20/17  1534     History   Chief Complaint Chief Complaint  Patient presents with  . Headache    HPI Ashley Kent is a 22 y.o. female.   Ashley Kent presents with complaints of intermittent headache for the past week. Denies any previous migraines headache. States comes and goes to different locations of her head, today has been to her left eye, lasts for 2-5 minutes every approximately 1 hour. Light worsens pain, yesterday had to wear sunglasses.   Also with complaints of abdominal cramping with vomiting and diarrhea which has been ongoing for the past week. Today has vomited twice. No diarrhea today. Yesterday vomited 5 times. No blood. States vomiting does not seem to relate to headache. Denies dizziness. States feels either hot or cold before vomiting or diarrhea. No recent travel. No urinary symptoms. Denies previous similar. States she is late on her period as well, although LMP 5/12. States her last period was shorter than usual for her. Denies vaginal symptoms. Normal urination. History of anxiety or depression.     ROS per HPI.      Past Medical History:  Diagnosis Date  . Anxiety   . Depression     Patient Active Problem List   Diagnosis Date Noted  . 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 No pertinent surgical history.  OB History    Gravida  0   Para      Term      Preterm      AB      Living        SAB      TAB      Ectopic      Multiple      Live Births               Home Medications    Prior to Admission medications   Medication Sig Start Date End Date Taking? Authorizing Provider  dicyclomine (BENTYL) 20 MG tablet Take 1 tablet (20 mg total) by mouth 2 (two) times daily. 06/20/17   Georgetta Haber, NP  ondansetron (ZOFRAN-ODT) 4 MG disintegrating tablet Take 1 tablet (4 mg total) by mouth  every 8 (eight) hours as needed for nausea or vomiting. 06/20/17   Georgetta Haber, NP    Family History Family History  Problem Relation Age of Onset  . Stroke Other   . Alcoholism Maternal Uncle     Social History Social History   Tobacco Use  . Smoking status: Former Games developer  . Smokeless tobacco: Never Used  Substance Use Topics  . Alcohol use: No    Comment: occ  . Drug use: No     Allergies   Orange (diagnostic); Sunscreens; and Zyrtec [cetirizine]   Review of Systems Review of Systems   Physical Exam Triage Vital Signs ED Triage Vitals  Enc Vitals Group     BP 06/20/17 1607 124/80     Pulse Rate 06/20/17 1607 95     Resp 06/20/17 1607 18     Temp 06/20/17 1607 98.8 F (37.1 C)     Temp Source 06/20/17 1607 Oral     SpO2 06/20/17 1607 100 %     Weight --      Height --      Head Circumference --      Peak Flow --  Pain Score 06/20/17 1605 7     Pain Loc --      Pain Edu? --      Excl. in GC? --    No data found.  Updated Vital Signs BP 124/80 (BP Location: Left Arm)   Pulse 95   Temp 98.8 F (37.1 C) (Oral)   Resp 18   LMP 05/27/2017   SpO2 100%    Physical Exam  Constitutional: She is oriented to person, place, and time. She appears well-developed and well-nourished. No distress.  Cardiovascular: Normal rate, regular rhythm and normal heart sounds.  Pulmonary/Chest: Effort normal and breath sounds normal.  Abdominal: Soft. Bowel sounds are normal. There is tenderness. There is no rigidity, no rebound, no guarding, no CVA tenderness, no tenderness at McBurney's point and negative Murphy's sign.  Neurological: She is alert and oriented to person, place, and time. She has normal strength. She is not disoriented. No cranial nerve deficit or sensory deficit. GCS eye subscore is 4. GCS verbal subscore is 5. GCS motor subscore is 6.  Skin: Skin is warm and dry.     UC Treatments / Results  Labs (all labs ordered are listed, but only abnormal  results are displayed) Labs Reviewed  POCT PREGNANCY, URINE    EKG None  Radiology No results found.  Procedures Procedures (including critical care time)  Medications Ordered in UC Medications  ondansetron (ZOFRAN-ODT) disintegrating tablet 4 mg (4 mg Oral Given 06/20/17 1725)  ketorolac (TORADOL) injection 60 mg (60 mg Intramuscular Given 06/20/17 1734)    Initial Impression / Assessment and Plan / UC Course  I have reviewed the triage vital signs and the nursing notes.  Pertinent labs & imaging results that were available during my care of the patient were reviewed by me and considered in my medical decision making (see chart for details).     Afebrile, without red flag findings for acute abdomen or neurological findings. Discussed limitation in UC for further lab/imaging as patient has had 1 week of vomiting symptoms. Recommend supportive cares for the next 24-48 hours but if persistent or any worsening of symptoms to go to ER. toradol and zofran given. Push fluids. Patient verbalized understanding and agreeable to plan.    Final Clinical Impressions(s) / UC Diagnoses   Final diagnoses:  Acute nonintractable headache, unspecified headache type  Abdominal cramping  Vomiting and diarrhea     Discharge Instructions     Push fluids to ensure adequate hydration and keep secretions thin.  Tylenol as needed for pain. Bentyl 2-3 times a day for stomach cramping. Zofran as needed for nausea. If persistent pain, cramping or vomiting or if no improvement or worsening in the next 2-3 days please go to Er for further evaluation.     ED Prescriptions    Medication Sig Dispense Auth. Provider   ondansetron (ZOFRAN-ODT) 4 MG disintegrating tablet Take 1 tablet (4 mg total) by mouth every 8 (eight) hours as needed for nausea or vomiting. 12 tablet Linus MakoBurky, Adja Ruff B, NP   dicyclomine (BENTYL) 20 MG tablet Take 1 tablet (20 mg total) by mouth 2 (two) times daily. 20 tablet Georgetta HaberBurky,  Teng Decou B, NP     Controlled Substance Prescriptions [place] Controlled Substance Registry consulted? Not Applicable   Georgetta HaberBurky, Aarian Griffie B, NP 06/20/17 1747

## 2017-07-30 ENCOUNTER — Encounter (HOSPITAL_COMMUNITY): Payer: Self-pay | Admitting: *Deleted

## 2017-07-30 ENCOUNTER — Emergency Department (HOSPITAL_COMMUNITY)
Admission: EM | Admit: 2017-07-30 | Discharge: 2017-07-30 | Disposition: A | Discharge status: Home / Self Care | Payer: Self-pay | Attending: Emergency Medicine | Admitting: Emergency Medicine

## 2017-07-30 ENCOUNTER — Emergency Department (HOSPITAL_COMMUNITY): Payer: Self-pay

## 2017-07-30 DIAGNOSIS — Z79899 Other long term (current) drug therapy: Secondary | ICD-10-CM | POA: Insufficient documentation

## 2017-07-30 DIAGNOSIS — Y999 Unspecified external cause status: Secondary | ICD-10-CM | POA: Insufficient documentation

## 2017-07-30 DIAGNOSIS — Y9289 Other specified places as the place of occurrence of the external cause: Secondary | ICD-10-CM | POA: Insufficient documentation

## 2017-07-30 DIAGNOSIS — Y9389 Activity, other specified: Secondary | ICD-10-CM | POA: Insufficient documentation

## 2017-07-30 DIAGNOSIS — Z87891 Personal history of nicotine dependence: Secondary | ICD-10-CM | POA: Insufficient documentation

## 2017-07-30 DIAGNOSIS — W230XXA Caught, crushed, jammed, or pinched between moving objects, initial encounter: Secondary | ICD-10-CM | POA: Insufficient documentation

## 2017-07-30 DIAGNOSIS — S9032XA Contusion of left foot, initial encounter: Secondary | ICD-10-CM | POA: Insufficient documentation

## 2017-07-30 NOTE — ED Triage Notes (Signed)
Pt complains of pain in left foot since closing her foot in a car door. Pt has limited ROM, pain, swelling in her left 4th and 5th toes.

## 2017-07-30 NOTE — Discharge Instructions (Signed)
Wear post op shoe and Use crutches as needed for comfort. Ice and elevate foot throughout the day, using ice pack for no more than 20 minutes every hour.  Alternate between tylenol and ibuprofen as needed for pain relief. Call orthopedic follow up today or tomorrow to schedule followup appointment for recheck of ongoing foot pain in 1-2 weeks. Return to the ER for changes or worsening symptoms.

## 2017-07-30 NOTE — ED Provider Notes (Signed)
[facility]-EMERGENCY DEPT Provider Note   CSN: [id number] Arrival date & time: 07/30/17  1731     History   Chief Complaint Chief Complaint  Patient presents with  . Foot Injury    left    HPI Ashley Kent is a 22 y.o. female with a PMHx of depression and anxiety, who presents to the ED with complaints of left foot pain that began 2 days ago after she accidentally had a car door closed on it.  She describes the pain as 9/10 constant throbbing aching nonradiating left foot pain that worsens with weightbearing and palpation of the area, and with no treatments tried prior to arrival.  She reports associated swelling and bruising.  She denies any other injury sustained during the incident, any wounds, numbness, tingling, focal weakness, or any other complaints at this time.  She does not have an orthopedist that she sees.  The history is provided by the patient and medical records. No language interpreter was used.  Foot Injury   Pertinent negatives include no numbness.    Past Medical History:  Diagnosis Date  . Anxiety   . Depression     Patient Active Problem List   Diagnosis Date Noted  . 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 No pertinent surgical history.   OB History    Gravida  0   Para      Term      Preterm      AB      Living        SAB      TAB      Ectopic      Multiple      Live Births               Home Medications    Prior to Admission medications   Medication Sig Start Date End Date Taking? Authorizing Provider  dicyclomine (BENTYL) 20 MG tablet Take 1 tablet (20 mg total) by mouth 2 (two) times daily. 06/20/17   Georgetta Haber, NP  ondansetron (ZOFRAN-ODT) 4 MG disintegrating tablet Take 1 tablet (4 mg total) by mouth every 8 (eight) hours as needed for nausea or vomiting. 06/20/17   Georgetta Haber, NP    Family History Family History   Problem Relation Age of Onset  . Stroke Other   . Alcoholism Maternal Uncle     Social History Social History   Tobacco Use  . Smoking status: Former Games developer  . Smokeless tobacco: Never Used  Substance Use Topics  . Alcohol use: No    Comment: occ  . Drug use: No     Allergies   Orange (diagnostic); Sunscreens; and Zyrtec [cetirizine]   Review of Systems Review of Systems  Musculoskeletal: Positive for arthralgias and joint swelling.  Skin: Positive for color change. Negative for wound.  Allergic/Immunologic: Negative for immunocompromised state.  Neurological: Negative for weakness and numbness.  Psychiatric/Behavioral: Negative for confusion.     Physical Exam Updated Vital Signs BP 119/75 (BP Location: Left Arm)   Pulse 90   Temp 98.9 F (37.2 C) (Oral)   Resp 18   LMP 07/16/2017   SpO2 100%   Physical Exam  Constitutional: She is oriented to person, place, and time. Vital signs are normal. She appears well-developed and well-nourished.  Non-toxic appearance. No distress.  Afebrile, nontoxic, NAD  HENT:  Head: Normocephalic and atraumatic.  Mouth/Throat: Mucous membranes are  normal.  Eyes: Conjunctivae and EOM are normal. Right eye exhibits no discharge. Left eye exhibits no discharge.  Neck: Normal range of motion. Neck supple.  Cardiovascular: Normal rate and intact distal pulses.  Pulmonary/Chest: Effort normal. No respiratory distress.  Abdominal: Normal appearance. She exhibits no distension.  Musculoskeletal: Normal range of motion.       Left foot: There is tenderness and bony tenderness. There is normal range of motion, no swelling, normal capillary refill, no crepitus, no deformity and no laceration.  L foot with mild TTP across the forefoot and into the toes diffusely, mild bruising, no swelling or crepitus, no deformity, no wounds, wiggles all toes without difficulty, sensation grossly intact, distal pulses intact, compartments soft. No other  areas of focal bony/joint line TTP to remainder of L lower leg/ankle  Neurological: She is alert and oriented to person, place, and time. She has normal strength. No sensory deficit.  Skin: Skin is warm, dry and intact. Bruising noted. No rash noted.  L foot bruise as mentioned above  Psychiatric: She has a normal mood and affect. Her behavior is normal.  Nursing note and vitals reviewed.    ED Treatments / Results  Labs (all labs ordered are listed, but only abnormal results are displayed) Labs Reviewed - No data to display  EKG None  Radiology Dg Foot Complete Left  Result Date: 07/30/2017 CLINICAL DATA:  Left foot pain since closing foot in a car door. EXAM: LEFT FOOT - COMPLETE 3+ VIEW COMPARISON:  None. FINDINGS: There is no evidence of fracture or dislocation. Tiny rounded ossific density adjacent to the cuboid likely reflects a small ossicle or phlebolith. There is no evidence of arthropathy or other focal bone abnormality. Soft tissues are unremarkable. IMPRESSION: No fracture or malalignment of the left foot and included ankle. Electronically Signed   By: Tollie Ethavid  Kwon M.D.   On: 07/30/2017 19:35    Procedures Procedures (including critical care time)  Medications Ordered in ED Medications - No data to display   Initial Impression / Assessment and Plan / ED Course  I have reviewed the triage vital signs and the nursing notes.  Pertinent labs & imaging results that were available during my care of the patient were reviewed by me and considered in my medical decision making (see chart for details).     22 y.o. female here with L foot pain after car door closed on it. On exam, mild bruising to dorsal aspect of the forefoot, mild TTP diffusely across the forefoot and toes, wiggles toes without difficulty, NVI with soft compartments. No wounds. Xray negative for fx. Likely contusion. Will place in post op shoe and give crutches for comfort. Advised RICE/tylenol/motrin for pain,  and f/up with ortho in 1wk for recheck. I explained the diagnosis and have given explicit precautions to return to the ER including for any other new or worsening symptoms. The patient understands and accepts the medical plan as it's been dictated and I have answered their questions. Discharge instructions concerning home care and prescriptions have been given. The patient is STABLE and is discharged to home in good condition.    Final Clinical Impressions(s) / ED Diagnoses   Final diagnoses:  Contusion of left foot, initial encounter    ED Discharge Orders    [address]None       Primus Gritton, BlawenburgMercedes, [place] JerseyPA-C 07/30/17 2116    Tegeler, Canary Brimhristopher J, MD 07/31/17 [phone]

## 2018-02-22 ENCOUNTER — Other Ambulatory Visit: Payer: Self-pay

## 2018-02-22 ENCOUNTER — Emergency Department (HOSPITAL_COMMUNITY)
Admission: EM | Admit: 2018-02-22 | Discharge: 2018-02-23 | Disposition: A | Discharge status: Home / Self Care | Payer: BLUE CROSS/BLUE SHIELD | Attending: Emergency Medicine | Admitting: Emergency Medicine

## 2018-02-22 ENCOUNTER — Encounter (HOSPITAL_COMMUNITY): Payer: Self-pay | Admitting: Emergency Medicine

## 2018-02-22 DIAGNOSIS — F17200 Nicotine dependence, unspecified, uncomplicated: Secondary | ICD-10-CM | POA: Insufficient documentation

## 2018-02-22 DIAGNOSIS — N751 Abscess of Bartholin's gland: Secondary | ICD-10-CM | POA: Insufficient documentation

## 2018-02-22 DIAGNOSIS — R222 Localized swelling, mass and lump, trunk: Secondary | ICD-10-CM | POA: Diagnosis present

## 2018-02-22 LAB — WET PREP, GENITAL
Sperm: NONE SEEN
Trich, Wet Prep: NONE SEEN
Yeast Wet Prep HPF POC: NONE SEEN

## 2018-02-22 NOTE — ED Provider Notes (Signed)
[facility]   CSN: [id number] Arrival date & time: 02/22/18  2033     History   Chief Complaint Chief Complaint  Patient presents with  . Vaginal Pain    HPI Katelyn Patel is a 23 y.o. female with a hx of no major medical problems presents to the Emergency Department complaining of gradual, persistent, progressively worsening left vaginal swelling onset 2 days ago.  Pt reports she was masturbating 24 hours prior to the onset of swelling without pain.  Pt reports she is not currently sexually active and reports she is a "virgin."  She reports she is a same sex relationship, but "gives sex" and does not receive it.  She denies hx of STDs.  Pt denies abd pain, N/V/D, vaginal discharge.  LMP: Jan 7th.  Pt denies fever, chills, headache, neck pain, chest pain, SOB, abd pain, N/V/D, weakness, dizziness, syncope.    The history is provided by the patient and medical records. No language interpreter was used.    History reviewed. No pertinent past medical history.  There are no active problems to display for this patient.   History reviewed. No pertinent surgical history.   OB History   No obstetric history on file.      Home Medications    Prior to Admission medications   Medication Sig Start Date End Date Taking? Authorizing Provider  doxycycline (VIBRAMYCIN) 100 MG capsule Take 1 capsule (100 mg total) by mouth 2 (two) times daily. 02/23/18   Concepcion Gillott, Dahlia ClientHannah, PA-C  metoCLOPramide (REGLAN) 10 MG tablet Take 1 tablet (10 mg total) by mouth every 6 (six) hours as needed for nausea or vomiting. Patient not taking: Reported on 02/22/2018 05/21/17   Rennis HardingWurst, Brittany, PA-C    Family History No family history on file.  Social History Social History   Tobacco Use  . Smoking status: Current Every Day Smoker  . Smokeless tobacco: Never Used  Substance Use Topics  . Alcohol use: Not Currently  . Drug use: Not Currently      Allergies   Watermelon flavor   Review of Systems Review of Systems  Constitutional: Negative for appetite change, diaphoresis, fatigue, fever and unexpected weight change.  HENT: Negative for mouth sores.   Eyes: Negative for visual disturbance.  Respiratory: Negative for cough, chest tightness, shortness of breath and wheezing.   Cardiovascular: Negative for chest pain.  Gastrointestinal: Negative for abdominal pain, constipation, diarrhea, nausea and vomiting.  Endocrine: Negative for polydipsia, polyphagia and polyuria.  Genitourinary: Positive for vaginal pain. Negative for dysuria, frequency, hematuria and urgency.  Musculoskeletal: Negative for back pain and neck stiffness.  Skin: Negative for rash.  Allergic/Immunologic: Negative for immunocompromised state.  Neurological: Negative for syncope, light-headedness and headaches.  Hematological: Does not bruise/bleed easily.  Psychiatric/Behavioral: Negative for sleep disturbance. The patient is nervous/anxious.      Physical Exam Updated Vital Signs BP 118/74 (BP Location: Left Arm)   Pulse (!) 102   Temp 98.7 F (37.1 C) (Oral)   Resp 17   LMP 01/22/2018   SpO2 100%   Physical Exam Vitals signs and nursing note reviewed.  Constitutional:      General: She is not in acute distress.    Appearance: She is well-developed. She is not diaphoretic.  HENT:     Head: Normocephalic and atraumatic.  Eyes:     Conjunctiva/sclera: Conjunctivae normal.  Neck:     Musculoskeletal: Normal range of motion.  Cardiovascular:  Rate and Rhythm: Normal rate and regular rhythm.  Pulmonary:     Effort: Pulmonary effort is normal. No respiratory distress.  Abdominal:     General: Bowel sounds are normal.     Palpations: Abdomen is soft.     Tenderness: There is no abdominal tenderness. There is no guarding or rebound.     Hernia: There is no hernia in the right inguinal area or left inguinal area.  Genitourinary:     Labia:        Right: No rash, tenderness or lesion.        Left: Tenderness present. No rash or lesion.      Vagina: No signs of injury and foreign body. No vaginal discharge, erythema, tenderness or bleeding.     Cervix: No cervical motion tenderness, discharge or friability.     Uterus: Not deviated, not enlarged, not fixed and not tender.      Adnexa:        Right: No mass, tenderness or fullness.         Left: No mass, tenderness or fullness.       Comments: Palpable fluctuance and swelling of the left labia in the region of the bartholin gland Musculoskeletal: Normal range of motion.  Skin:    General: Skin is warm and dry.     Findings: No erythema.  Neurological:     Mental Status: She is alert.      ED Treatments / Results  Labs (all labs ordered are listed, but only abnormal results are displayed) Labs Reviewed  WET PREP, GENITAL - Abnormal; Notable for the following components:      Result Value   Clue Cells Wet Prep HPF POC PRESENT (*)    WBC, Wet Prep HPF POC MANY (*)    All other components within normal limits  URINALYSIS, ROUTINE W REFLEX MICROSCOPIC  POC URINE PREG, ED  GC/CHLAMYDIA PROBE AMP ([place]) NOT AT [facility]    EKG None  Radiology No results found.  Procedures .Marland KitchenIncision and Drainage Date/Time: 02/23/2018 2:12 AM Performed by: Dierdre Forth, PA-C Authorized by: Dierdre Forth, PA-C   Consent:    Consent obtained:  Verbal   Consent given by:  Patient   Risks discussed:  Bleeding, incomplete drainage, pain and damage to other organs   Alternatives discussed:  No treatment Universal protocol:    Procedure explained and questions answered to patient or proxy's satisfaction: yes     Relevant documents present and verified: yes     Test results available and properly labeled: yes     Imaging studies available: yes     Required blood products, implants, devices, and special equipment available: yes     Site/side marked: yes      Immediately prior to procedure a time out was called: yes     Patient identity confirmed:  Verbally with patient Location:    Type:  Bartholin cyst   Location: left labia. Pre-procedure details:    Skin preparation:  Betadine Anesthesia (see MAR for exact dosages):    Anesthesia method:  Local infiltration   Local anesthetic:  Lidocaine 1% w/o epi Procedure type:    Complexity:  Complex Procedure details:    Incision types:  Stab incision   Incision depth:  Subcutaneous   Scalpel blade:  11   Wound management:  Probed and deloculated, irrigated with saline and extensive cleaning   Drainage:  Purulent   Drainage amount:  Copious   Packing materials:  Word  catheter Post-procedure details:    Patient tolerance of procedure:  Tolerated well, no immediate complications   (including critical care time)  EMERGENCY DEPARTMENT US SOFT TISSUE INTERPRETATION "Study: Limited Soft Tissue Ultrasound"  INDICATIONS: Pain and Soft tissue infection Multiple views of the body part were obtained in real-time with a multi-frequency linear probe  PERFORMED BY: Myself IMAGES ARCHIVED?: Yes SIDE:Left BODY PART:labia INTERPRETATION:  Abcess present     Medications Ordered in ED Medications  lidocaine (PF) (XYLOCAINE) 1 % injection 30 mL (has no administration in time range)  ibuprofen (ADVIL,MOTRIN) tablet 800 mg (800 mg Oral Given 02/23/18 0050)     Initial Impression / Assessment and Plan / ED Course  I have reviewed the triage vital signs and the nursing notes.  Pertinent labs & imaging results that were available during my care of the patient were reviewed by me and considered in my medical decision making (see chart for details).     Patient presents with Bartholin gland abscess.  Confirmed with ultrasound.  Incision and drainage with copious amounts of purulent drainage.  Abscess irrigated with saline in addition to being probed and deloculated.  Word catheter placed.  Will be  discharged home with close OB/GYN follow-up.  Will give doxycycline.  Warm sitz baths recommended.  Also discussed reasons to return immediately to the emergency department.  Patient states understanding and is in agreement with the plan.  Final Clinical Impressions(s) / ED Diagnoses   Final diagnoses:  Bartholin's gland abscess    ED Discharge Orders         Ordered    doxycycline (VIBRAMYCIN) 100 MG capsule  2 times daily     02/23/18 0215           Thiago Ragsdale, Boyd KerbsHannah, PA-C 02/23/18 0216    Gerhard MunchLockwood, Robert, MD 02/23/18 1843

## 2018-02-22 NOTE — ED Notes (Signed)
Changing into gown

## 2018-02-22 NOTE — ED Triage Notes (Signed)
C/o vaginal pain and swelling x 2-3 days.  Denies discharge or itching.

## 2018-02-23 LAB — POC URINE PREG, ED: Preg Test, Ur: NEGATIVE

## 2018-02-23 MED ORDER — DOXYCYCLINE HYCLATE 100 MG PO CAPS: 100.0000 mg | ORAL_CAPSULE | Freq: Two times a day (BID) | ORAL | 0 refills | Status: DC

## 2018-02-23 MED ORDER — IBUPROFEN 800 MG PO TABS
800.0000 mg | ORAL_TABLET | Freq: Once | ORAL | Status: AC
  Administered 2018-02-23: 800 mg via ORAL
  Filled 2018-02-23: qty 1

## 2018-02-23 MED ORDER — LIDOCAINE HCL (PF) 1 % IJ SOLN
30.0000 mL | Freq: Once | INTRAMUSCULAR | Status: DC
  Filled 2018-02-23: qty 30

## 2018-02-23 NOTE — ED Notes (Signed)
Patient verbalizes understanding of discharge instructions. Opportunity for questioning and answers were provided. Armband removed by staff, pt discharged from ED in wheelchair.  

## 2018-02-23 NOTE — Discharge Instructions (Addendum)
1. Medications: Doxycycline -please complete the entire prescription, usual home medications 2. Treatment: rest, drink plenty of fluids, use warm compresses, flush abscess with warm water several times per day 3. Follow Up: Please followup with your primary doctor or OB/GYN in 2-3 days for discussion of your diagnoses and further evaluation after today's visit; if you do not have a primary care doctor use the resource guide provided to find one; Please return to the ER for fevers, chills, nausea, vomiting or other signs of infection

## 2018-02-25 LAB — GC/CHLAMYDIA PROBE AMP (~~LOC~~) NOT AT ARMC
CHLAMYDIA, DNA PROBE: NEGATIVE
NEISSERIA GONORRHEA: NEGATIVE

## 2018-05-11 ENCOUNTER — Encounter (HOSPITAL_COMMUNITY): Payer: Self-pay | Admitting: Physician Assistant

## 2018-05-11 ENCOUNTER — Other Ambulatory Visit: Payer: Self-pay

## 2018-05-11 ENCOUNTER — Ambulatory Visit (HOSPITAL_COMMUNITY)
Admission: EM | Admit: 2018-05-11 | Discharge: 2018-05-11 | Disposition: A | Discharge status: Home / Self Care | Payer: BLUE CROSS/BLUE SHIELD | Attending: Family Medicine | Admitting: Family Medicine

## 2018-05-11 DIAGNOSIS — N750 Cyst of Bartholin's gland: Secondary | ICD-10-CM | POA: Diagnosis not present

## 2018-05-11 MED ORDER — SULFAMETHOXAZOLE-TRIMETHOPRIM 800-160 MG PO TABS: 1.0000 | ORAL_TABLET | Freq: Two times a day (BID) | ORAL | 0 refills | Status: AC

## 2018-05-11 NOTE — Discharge Instructions (Signed)
No abscess noted today. Start bactrim to cover for infection. Continue warm soaks. Ibuprofen as needed for pain. Follow up with GYN for further evaluation if continues with frequent swelling and pain to the area.

## 2018-05-11 NOTE — ED Provider Notes (Signed)
[facility]    CSN: [id number] Arrival date & time: 05/11/18  1402     History   Chief Complaint Chief Complaint  Patient presents with  . Abscess    HPI Katelyn Patel is a 23 y.o. female.   23 year old female comes in for possible bartholin abscess for the past 2 days.  Patient states had a Bartholin abscess drained in February, that completely resolved prior to current symptom onset.  States pain at the same location with increased swelling.  She has been doing sitz bath's, Epson salt soaks, warm compresses, and has been squeezing area on her own with temporary relief.  Swelling has been improving, but returning the next day.  Pain has been constant.  Has not taken any medications for the symptoms.     History reviewed. No pertinent past medical history.  There are no active problems to display for this patient.   History reviewed. No pertinent surgical history.  OB History   No obstetric history on file.      Home Medications    Prior to Admission medications   Medication Sig Start Date End Date Taking? Authorizing Provider  metoCLOPramide (REGLAN) 10 MG tablet Take 1 tablet (10 mg total) by mouth every 6 (six) hours as needed for nausea or vomiting. Patient not taking: Reported on 02/22/2018 05/21/17   Alvino ChapelWurst, [place], PA-C  sulfamethoxazole-trimethoprim (BACTRIM DS) 800-160 MG tablet Take 1 tablet by mouth 2 (two) times daily for 7 days. 05/11/18 05/18/18  Belinda FisherYu, Amy V, PA-C    Family History No family history on file.  Social History Social History   Tobacco Use  . Smoking status: Current Every Day Smoker  . Smokeless tobacco: Never Used  Substance Use Topics  . Alcohol use: Not Currently  . Drug use: Not Currently     Allergies   Watermelon flavor   Review of Systems Review of Systems  Reason unable to perform ROS: See HPI as above.     Physical Exam Triage Vital Signs ED Triage Vitals  Enc Vitals Group     BP 05/11/18 1415 118/83      Pulse Rate 05/11/18 1415 77     Resp 05/11/18 1415 14     Temp 05/11/18 1415 97.8 F (36.6 C)     Temp Source 05/11/18 1415 Oral     SpO2 05/11/18 1415 100 %     Weight --      Height --      Head Circumference --      Peak Flow --      Pain Score 05/11/18 1414 10     Pain Loc --      Pain Edu? --      Excl. in GC? --    No data found.  Updated Vital Signs BP 118/83 (BP Location: Right Arm)   Pulse 77   Temp 97.8 F (36.6 C) (Oral)   Resp 14   LMP 04/15/2018   SpO2 100%   Physical Exam Constitutional:      General: She is not in acute distress.    Appearance: She is well-developed. She is not diaphoretic.  HENT:     Head: Normocephalic and atraumatic.  Eyes:     Conjunctiva/sclera: Conjunctivae normal.     Pupils: Pupils are equal, round, and reactive to light.  Genitourinary:    Comments: Left Bartholin cyst palpated.  No erythema, warmth.  Mild tenderness to palpation.  No fluctuance felt.  No obvious swelling. Neurological:  Mental Status: She is alert and oriented to person, place, and time.    UC Treatments / Results  Labs (all labs ordered are listed, but only abnormal results are displayed) Labs Reviewed - No data to display  EKG None  Radiology No results found.  Procedures Procedures (including critical care time)  Medications Ordered in UC Medications - No data to display  Initial Impression / Assessment and Plan / UC Course  I have reviewed the triage vital signs and the nursing notes.  Pertinent labs & imaging results that were available during my care of the patient were reviewed by me and considered in my medical decision making (see chart for details).    No abscess on exam.  Will start patient on Bactrim to cover for infection.  Continue sitz bath/Epson salt soaks.  Return precautions given.  Patient expresses understanding and agrees to plan.  Final Clinical Impressions(s) / UC Diagnoses   Final diagnoses:  Bartholin cyst     ED Prescriptions    Medication Sig Dispense Auth. Provider   sulfamethoxazole-trimethoprim (BACTRIM DS) 800-160 MG tablet Take 1 tablet by mouth 2 (two) times daily for 7 days. 14 tablet Threasa Alpha, [place] 05/11/18 1444

## 2018-05-11 NOTE — ED Triage Notes (Signed)
Per pt she had an abscess at the first of February and had it drained in ED now it has cam e back and is draining but have more pain than normal.

## 2018-11-10 ENCOUNTER — Inpatient Hospital Stay (HOSPITAL_COMMUNITY): Payer: Self-pay

## 2018-11-10 ENCOUNTER — Other Ambulatory Visit: Payer: Self-pay

## 2018-11-10 ENCOUNTER — Encounter (HOSPITAL_COMMUNITY): Payer: Self-pay

## 2018-11-10 ENCOUNTER — Inpatient Hospital Stay (HOSPITAL_COMMUNITY)
Admission: AD | Admit: 2018-11-10 | Discharge: 2018-11-11 | Disposition: A | Discharge status: Home / Self Care | Payer: Self-pay | Attending: Obstetrics and Gynecology | Admitting: Obstetrics and Gynecology

## 2018-11-10 DIAGNOSIS — Z87891 Personal history of nicotine dependence: Secondary | ICD-10-CM | POA: Insufficient documentation

## 2018-11-10 DIAGNOSIS — R109 Unspecified abdominal pain: Secondary | ICD-10-CM | POA: Insufficient documentation

## 2018-11-10 DIAGNOSIS — Z3A12 12 weeks gestation of pregnancy: Secondary | ICD-10-CM | POA: Insufficient documentation

## 2018-11-10 DIAGNOSIS — O26891 Other specified pregnancy related conditions, first trimester: Secondary | ICD-10-CM | POA: Insufficient documentation

## 2018-11-10 DIAGNOSIS — O039 Complete or unspecified spontaneous abortion without complication: Secondary | ICD-10-CM

## 2018-11-10 DIAGNOSIS — O209 Hemorrhage in early pregnancy, unspecified: Secondary | ICD-10-CM | POA: Insufficient documentation

## 2018-11-10 DIAGNOSIS — Z79899 Other long term (current) drug therapy: Secondary | ICD-10-CM | POA: Insufficient documentation

## 2018-11-10 LAB — URINALYSIS, ROUTINE W REFLEX MICROSCOPIC
Bilirubin Urine: NEGATIVE
Glucose, UA: NEGATIVE mg/dL
Ketones, ur: NEGATIVE mg/dL
Nitrite: NEGATIVE
Protein, ur: NEGATIVE mg/dL
Specific Gravity, Urine: 1.02 (ref 1.005–1.030)
pH: 6 (ref 5.0–8.0)

## 2018-11-10 LAB — CBC
HCT: 35.9 % — ABNORMAL LOW (ref 36.0–46.0)
Hemoglobin: 12.5 g/dL (ref 12.0–15.0)
MCH: 34.6 pg — ABNORMAL HIGH (ref 26.0–34.0)
MCHC: 34.8 g/dL (ref 30.0–36.0)
MCV: 99.4 fL (ref 80.0–100.0)
Platelets: 203 10*3/uL (ref 150–400)
RBC: 3.61 MIL/uL — ABNORMAL LOW (ref 3.87–5.11)
RDW: 11.6 % (ref 11.5–15.5)
WBC: 5.7 10*3/uL (ref 4.0–10.5)
nRBC: 0 % (ref 0.0–0.2)

## 2018-11-10 LAB — POCT PREGNANCY, URINE: Preg Test, Ur: POSITIVE — AB

## 2018-11-10 LAB — HCG, QUANTITATIVE, PREGNANCY: hCG, Beta Chain, Quant, S: 1878 m[IU]/mL — ABNORMAL HIGH (ref <5)

## 2018-11-10 MED ORDER — MISOPROSTOL 200 MCG PO TABS
800.0000 ug | ORAL_TABLET | Freq: Once | ORAL | Status: AC
  Administered 2018-11-10: 800 ug via ORAL
  Filled 2018-11-10: qty 4

## 2018-11-10 MED ORDER — HYDROMORPHONE HCL 1 MG/ML IJ SOLN: 0.5000 mg | Freq: Once | INTRAMUSCULAR | Status: DC

## 2018-11-10 MED ORDER — HYDROMORPHONE HCL 1 MG/ML IJ SOLN
1.0000 mg | Freq: Once | INTRAMUSCULAR | Status: AC
  Administered 2018-11-10: 1 mg via INTRAMUSCULAR
  Filled 2018-11-10: qty 1

## 2018-11-10 MED ORDER — HYDROMORPHONE HCL 1 MG/ML IJ SOLN: 1.0000 mg | Freq: Once | INTRAMUSCULAR | Status: DC

## 2018-11-10 NOTE — MAU Provider Note (Addendum)
Chief Complaint: Abdominal Pain and Vaginal Bleeding   First Provider Initiated Contact with Patient 11/10/18 1751     SUBJECTIVE HPI: Ashley Kent is a 23 y.o. G1P0 at 2252w1d who presents to [facility] reporting vaginal bleeding & abdominal pain. Symptoms started this morning. Saw blood in toilet today. Is not saturating pads or passing clots. Had an ultrasound done in August in [place]; states it didn't tell her if it saw a heartbeat & she was supposed to follow up but then she moved to [place].  No recent intercourse. No care since August.   Location: abdomen Quality: cramping Severity: 3/10 on pain scale Duration: 1 day Timing: intermittent Modifying factors: none Associated signs and symptoms: vaginal bleeding  Past Medical History:  Diagnosis Date  . Anxiety   . Depression    OB History  Gravida Para Term Preterm AB Living  1            SAB TAB Ectopic Multiple Live Births               # Outcome Date GA Lbr Len/2nd Weight Sex Delivery Anes PTL Lv  1 Current            History reviewed. No pertinent surgical history. Social History   Socioeconomic History  . Marital status: Single    Spouse name: Not on file  . Number of children: Not on file  . Years of education: Not on file  . Highest education level: Not on file  Occupational History  . Not on file  Social Needs  . Financial resource strain: Not on file  . Food insecurity    Worry: Not on file    Inability: Not on file  . Transportation needs    Medical: Not on file    Non-medical: Not on file  Tobacco Use  . Smoking status: Former Games developermoker  . Smokeless tobacco: Never Used  Substance and Sexual Activity  . Alcohol use: No    Comment: occ  . Drug use: No  . Sexual activity: Yes    Birth control/protection: None  Lifestyle  . Physical activity    Days per week: Not on file    Minutes per session: Not on file  . Stress: Not on file  Relationships  . Social Musicianconnections    Talks on  phone: Not on file    Gets together: Not on file    Attends religious service: Not on file    Active member of club or organization: Not on file    Attends meetings of clubs or organizations: Not on file    Relationship status: Not on file  . Intimate partner violence    Fear of current or ex partner: Not on file    Emotionally abused: Not on file    Physically abused: Not on file    Forced sexual activity: Not on file  Other Topics Concern  . Not on file  Social History Narrative  . Not on file   Family History  Problem Relation Age of Onset  . Stroke Other   . Alcoholism Maternal Uncle    No current facility-administered medications on file prior to encounter.    Current Outpatient Medications on File Prior to Encounter  Medication Sig Dispense Refill  . dicyclomine (BENTYL) 20 MG tablet Take 1 tablet (20 mg total) by mouth 2 (two) times daily. 20 tablet 0  . ondansetron (ZOFRAN-ODT) 4 MG disintegrating tablet Take 1 tablet (4 mg total) by mouth every  8 (eight) hours as needed for nausea or vomiting. 12 tablet 0   Allergies  Allergen Reactions  . Orange (Diagnostic) Swelling  . Sunscreens Rash  . Zyrtec [Cetirizine] Rash    bumps    I have reviewed patient's Past Medical Hx, Surgical Hx, Family Hx, Social Hx, medications and allergies.   Review of Systems  Constitutional: Negative.   Gastrointestinal: Positive for abdominal pain. Negative for constipation, diarrhea, nausea and vomiting.  Genitourinary: Positive for vaginal bleeding.    OBJECTIVE Patient Vitals for the past 24 hrs:  BP Temp Temp src Pulse Resp SpO2 Height Weight  11/10/18 1631 111/73 98.5 F (36.9 C) Oral 77 16 99 % - -  11/10/18 1627 - - - - - - 5\' 4"  (1.626 m) 66.8 kg   Constitutional: Well-developed, well-nourished female in no acute distress.  Cardiovascular: normal rate & rhythm, no murmur Respiratory: normal rate and effort. Lung sounds clear throughout GI: Abd soft, non-tender, Pos BS x  4. No guarding or rebound tenderness MS: Extremities nontender, no edema, normal ROM Neurologic: Alert and oriented x 4.  GU:   Spec exam: moderate amount of bright red blood & 3 cm clot removed from vagina. Blood cleared out with 6 fox swabs. Slow trickle of bright red blood from os. Os visually closed.   LAB RESULTS Results for orders placed or performed during the hospital encounter of 11/10/18 (from the past 24 hour(s))  Urinalysis, Routine w reflex microscopic     Status: Abnormal   Collection Time: 11/10/18  4:24 PM  Result Value Ref Range   Color, Urine YELLOW YELLOW   APPearance CLOUDY (A) CLEAR   Specific Gravity, Urine 1.020 1.005 - 1.030   pH 6.0 5.0 - 8.0   Glucose, UA NEGATIVE NEGATIVE mg/dL   Hgb urine dipstick LARGE (A) NEGATIVE   Bilirubin Urine NEGATIVE NEGATIVE   Ketones, ur NEGATIVE NEGATIVE mg/dL   Protein, ur NEGATIVE NEGATIVE mg/dL   Nitrite NEGATIVE NEGATIVE   Leukocytes,Ua MODERATE (A) NEGATIVE   RBC / HPF 0-5 0 - 5 RBC/hpf   WBC, UA 0-5 0 - 5 WBC/hpf   Bacteria, UA RARE (A) NONE SEEN   Squamous Epithelial / LPF 11-20 0 - 5   Mucus PRESENT   Pregnancy, urine POC     Status: Abnormal   Collection Time: 11/10/18  4:25 PM  Result Value Ref Range   Preg Test, Ur POSITIVE (A) NEGATIVE  CBC     Status: Abnormal   Collection Time: 11/10/18  6:25 PM  Result Value Ref Range   WBC 5.7 4.0 - 10.5 K/uL   RBC 3.61 (L) 3.87 - 5.11 MIL/uL   Hemoglobin 12.5 12.0 - 15.0 g/dL   HCT 11/12/18 (L) 49.7 - 02.6 %   MCV 99.4 80.0 - 100.0 fL   MCH 34.6 (H) 26.0 - 34.0 pg   MCHC 34.8 30.0 - 36.0 g/dL   RDW 37.8 58.8 - 50.2 %   Platelets 203 150 - 400 K/uL   nRBC 0.0 0.0 - 0.2 %  ABO/Rh     Status: None   Collection Time: 11/10/18  6:25 PM  Result Value Ref Range   ABO/RH(D)      O POS Performed at [facility], [address], [place], [place]   hCG, quantitative, pregnancy     Status: Abnormal   Collection Time: 11/10/18  6:25 PM  Result Value Ref Range    hCG, Beta Chain, Quant, S 1,878 (H) <  5 mIU/mL    IMAGING No results found.  MAU COURSE Orders Placed This Encounter  Procedures  . Culture, OB Urine  . US OB Comp Less 14 Wks  . Urinalysis, Routine w reflex microscopic  . CBC  . hCG, quantitative, pregnancy  . Pregnancy, urine POC  . ABO/Rh   No orders of the defined types were placed in this encounter.   MDM +UPT UA, CBC, ABO/Rh, quant hCG, and [place] today to rule out ectopic pregnancy  RH positive   Final ultrasound report pending. Prelim appears to be a [redacted]w[redacted]d missed ab. Discussed patient with Dr. Rosana Hoes. Will offer cytotec & obs in MAU d/t amount of bleeding during my exam.  Cytotec 800 mcg buccally  Care turned over to Dr. Rubye Beach, Junie Panning, NP 11/10/2018  8:04 PM  Care assumed from Jorje Guild at 8:04 PM -Called to bedside at 9:10 PM for passage of large clots -Firm, gray, rubbery mass (measuring approximately 2x3 cm in surrounding blood clot), most likely products of conception.  US Ob Comp Less 14 Wks  Result Date: 11/10/2018 CLINICAL DATA:  Vaginal bleeding EXAM: OBSTETRIC <14 WK ULTRASOUND TECHNIQUE: Transabdominal ultrasound was performed for evaluation of the gestation as well as the maternal uterus and adnexal regions. COMPARISON:  None. FINDINGS: Intrauterine gestational sac: Present but somewhat irregular Yolk sac:  Absent Embryo:  Present Cardiac Activity: Absent CRL:   11.7 mm mm   7 w 2 d Subchorionic hemorrhage:  Small subchorionic hemorrhage is noted. Maternal uterus/adnexae: Ovaries are within normal limits. IMPRESSION: Intrauterine gestation with absent cardiac activity. Findings meet definitive criteria for failed pregnancy. This follows SRU consensus guidelines: Diagnostic Criteria for Nonviable Pregnancy Early in the First Trimester. Alison Stalling J Med [phone]. Electronically Signed   By: Inez Catalina M.D.   On: 11/10/2018 22:44   Case discussed with Dr. Rosana Hoes  -Buccal Cytotec 800 mcg  given at 2328  Reassessment at 68 -Cramping and pain has decreased, bleeding has improved, now only having small blood clots with bleeding like a period  ASSESSMENT AND PLAN 23 yo G1P0 at 64.2 EGA who presented to MAU reporting vaginal bleeding and abdominal pain -[place] c/w failed pregnancy -passage of large blood clots and possible fetal tissue after [place] -s/p 800 mcg buccal -Baseline Hgb 12.5 -Ibuprofen 600 mg 1 tablet by mouth every 6 hours as needed #30 - prescribed  -Tylenol #3 mg by mouth every 4 to 6 hours as needed - prescribed  -Phenergan 12.5 mg by mouth every 4 hours as needed for nausea - prescribed  Pt verbalizes that she lives close to the hospital and has transportation readily available.  Pt appears reliable and verbalizes understanding and agrees with plan of care  Message sent to clinic for follow up [place], Dan Europe, DO OB Fellow, [facility] 11/11/2018 3:05 AM

## 2018-11-10 NOTE — MAU Note (Signed)
Ashley Kent is a 23 y.o. at [redacted]w[redacted]d here in MAU reporting: thought she was feeling FM last night but then this AM when she went to the bathroom the toilet was filled with blood. When using the bathroom here she did not see any bleeding. Having some cramping.  LMP: 08/17/18  Onset of complaint: this morning   Pain score: 3/10  Vitals:   11/10/18 1631  BP: 111/73  Pulse: 77  Resp: 16  Temp: 98.5 F (36.9 C)  SpO2: 99%     Lab orders placed from triage: UA, UPT

## 2018-11-11 ENCOUNTER — Telehealth: Payer: Self-pay | Admitting: Family Medicine

## 2018-11-11 LAB — CULTURE, OB URINE

## 2018-11-11 LAB — ABO/RH: ABO/RH(D): O POS

## 2018-11-11 MED ORDER — IBUPROFEN 800 MG PO TABS: 800.0000 mg | ORAL_TABLET | Freq: Three times a day (TID) | ORAL | 0 refills | Status: DC | PRN

## 2018-11-11 MED ORDER — IBUPROFEN 800 MG PO TABS
800.0000 mg | ORAL_TABLET | Freq: Once | ORAL | Status: AC
  Administered 2018-11-11: 02:00:00 800 mg via ORAL
  Filled 2018-11-11: qty 1

## 2018-11-11 MED ORDER — ONDANSETRON 4 MG PO TBDP: 4.0000 mg | ORAL_TABLET | Freq: Three times a day (TID) | ORAL | 0 refills | Status: DC | PRN

## 2018-11-11 MED ORDER — ACETAMINOPHEN-CODEINE #3 300-30 MG PO TABS: 1.0000 | ORAL_TABLET | ORAL | 0 refills | Status: DC | PRN

## 2018-11-11 NOTE — Telephone Encounter (Signed)
Attempted to call patient x2 to get her scheduled for her bhcg lab work. No answer and unable to leave a message due to it not being setup.

## 2018-11-11 NOTE — Progress Notes (Signed)
Pt states that "she feels ok "

## 2018-11-11 NOTE — Discharge Instructions (Signed)
Managing Pregnancy Loss °Pregnancy loss can happen any time during a pregnancy. Often the cause is not known. It is rarely because of anything you did. Pregnancy loss in early pregnancy (during the first trimester) is called a miscarriage. This type of pregnancy loss is the most common. Pregnancy loss that happens after 20 weeks of pregnancy is called fetal demise if the baby's heart stops beating before birth. Fetal demise is much less common. Some women experience spontaneous labor shortly after fetal demise resulting in a stillborn birth (stillbirth). °Any pregnancy loss can be devastating. You will need to recover both physically and emotionally. Most women are able to get pregnant again after a pregnancy loss and deliver a healthy baby. °How to manage emotional recovery ° °Pregnancy loss is very hard emotionally. You may feel many different emotions while you grieve. You may feel sad and angry. You may also feel guilty. It is normal to have periods of crying. Emotional recovery can take longer than physical recovery. It is different for everyone. °Taking these steps can help you in managing this loss: °· Remember that it is unlikely you did anything to cause the pregnancy loss. °· Share your thoughts and feelings with friends, family, and your partner. Remember that your partner is also recovering emotionally. °· Make sure you have a good support system. Do not spend too much time alone. °· Meet with a pregnancy loss counselor or join a pregnancy loss support group. °· Get enough sleep and eat a healthy diet. Return to regular exercise when you have recovered physically. °· Do not use drugs or alcohol to manage your emotions. °· Consider seeing a mental health professional to help you recover emotionally. °· Ask a friend or loved one to help you decide what to do with any clothing and nursery items you received for your baby. °In the case of a stillbirth, many women benefit from taking additional steps in the  grieving process. You may want to: °· Hold your baby after the birth. °· Name your baby. °· Request a birth certificate. °· Create a keepsake such as handprints or footprints. °· Dress your baby and have a picture taken. °· Make funeral arrangements. °· Ask for a baptism or blessing. °Hospitals have staff members who can help you with all these arrangements. °How to recognize emotional stress °It is normal to have emotional stress after a pregnancy loss. But emotional stress that lasts a long time or becomes severe requires treatment. Watch out for these signs of severe emotional stress: °· Sadness, anger, or guilt that is not going away and is interfering with your normal activities. °· Relationship problems that have occurred or gotten worse since the pregnancy loss. °· Signs of depression that last longer than 2 weeks. These may include: °? Sadness. °? Anxiety. °? Hopelessness. °? Loss of interest in activities you enjoy. °? Inability to concentrate. °? Trouble sleeping or sleeping too much. °? Loss of appetite or overeating. °? Thoughts of death or of hurting yourself. °Follow these instructions at home: °· Take over-the-counter and prescription medicines only as told by your health care provider. °· Rest at home until your energy level returns. Return to your normal activities as told by your health care provider. Ask your health care provider what activities are safe for you. °· When you are ready, meet with your health care provider to discuss steps to take for a future pregnancy. °· Keep all follow-up visits as told by your health care provider. This is important. °  Where to find support  To help you and your partner with the process of grieving, talk with your health care provider or seek counseling.  Consider meeting with others who have experienced pregnancy loss. Ask your health care provider about support groups and resources. Where to find more information  U.S. Department of Health and Holiday representative on Women's Health: [place]s.[place]  American Pregnancy Association: www.americanpregnancy.org Contact a health care provider if:  You continue to experience grief, sadness, or lack of motivation for everyday activities, and those feelings do not improve over time.  You are struggling to recover emotionally, especially if you are using alcohol or substances to help. Get help right away if:  You have thoughts of hurting yourself or others. If you ever feel like you may hurt yourself or others, or have thoughts about taking your own life, get help right away. You can go to your nearest emergency department or call:  Your local emergency services (911 in the [place]  A suicide crisis helpline, such as the [facility] at 9186747844. This is open 24 hours a day. Summary  Any pregnancy loss can be difficult physically and emotionally.  You may experience many different emotions while you grieve. Emotional recovery can last longer than physical recovery.  It is normal to have emotional stress after a pregnancy loss. But emotional stress that lasts a long time or becomes severe requires treatment.  See your health care provider if you are struggling emotionally after a pregnancy loss. This information is not intended to replace advice given to you by your health care provider. Make sure you discuss any questions you have with your health care provider. Document Released: 03/15/2017 Document Revised: 04/24/2018 Document Reviewed: 03/15/2017 Elsevier Patient Education  [employer]. Misoprostol tablets What is this medicine? MISOPROSTOL (mye soe PROST ole) helps to prevent stomach ulcers in patients who take medicines like ibuprofen and aspirin and who are at high risk of complications from ulcers. This medicine may be used for other purposes; ask your health care provider or pharmacist if you have questions. COMMON BRAND NAME(S):  Cytotec What should I tell my health care provider before I take this medicine? They need to know if you have any of these conditions:  Crohn's disease  heart disease  kidney disease  ulcerative colitis  an unusual or allergic reaction to misoprostol, prostaglandins, other medicines, foods, dyes, or preservatives  pregnant or trying to get pregnant  breast-feeding How should I use this medicine? Take this medicine by mouth with a full glass of water. Follow the directions on the prescription label. Take this medicine with food.Take your medicine at regular intervals. Do not take your medicine more often than directed. Talk to your pediatrician regarding the use of this medicine in children. Special care may be needed. Overdosage: If you think you have taken too much of this medicine contact a poison control center or emergency room at once. NOTE: This medicine is only for you. Do not share this medicine with others. What if I miss a dose? If you miss a dose, take it as soon as you can. If it is almost time for your next dose, take only that dose. Do not take double or extra doses. What may interact with this medicine?  antacids This list may not describe all possible interactions. Give your health care provider a list of all the medicines, herbs, non-prescription drugs, or dietary supplements you use. Also tell them if you smoke, drink  alcohol, or use illegal drugs. Some items may interact with your medicine. What should I watch for while using this medicine? Do not smoke cigarettes or drink alcohol. These increase irritation to your stomach and can make it more susceptible to damage from medicine like ibuprofen and aspirin. If you are female, do not use this medicine if you are pregnant. Do not get pregnant while taking this medicine and for at least one month (one full menstrual cycle) after stopping this medicine. If you can become pregnant, use a reliable form of birth control while  taking this medicine. Talk to your doctor about birth control options. If you do become pregnant, think you are pregnant, or want to become pregnant, immediately call your doctor for advice. What side effects may I notice from receiving this medicine? Side effects that you should report to your doctor or health care professional as soon as possible:  allergic reactions like skin rash, itching or hives, swelling of the face, lips, or tongue  chest pain  fainting spells  severe diarrhea  sudden shortness of breath  unusual vaginal bleeding, pelvic pain, or cramping Side effects that usually do not require medical attention (report to your doctor or health care professional if they continue or are bothersome):  dizziness  headache  menstrual irregularity, spotting, or cramps  mild diarrhea  nausea  stomach upset or cramps This list may not describe all possible side effects. Call your doctor for medical advice about side effects. You may report side effects to FDA at 1-800-FDA-1088. Where should I keep my medicine? Keep out of the reach of children. Store at room temperature below 25 degrees C (77 degrees F). Keep in a dry place. Protect from moisture. Throw away any unused medicine after the expiration date. NOTE: This sheet is a summary. It may not cover all possible information. If you have questions about this medicine, talk to your doctor, pharmacist, or health care provider.  2020 Elsevier/Gold Standard (2007-12-17 10:59:53)

## 2018-11-13 LAB — SURGICAL PATHOLOGY

## 2018-11-15 ENCOUNTER — Other Ambulatory Visit: Payer: Self-pay

## 2018-11-15 DIAGNOSIS — Z20822 Contact with and (suspected) exposure to covid-19: Secondary | ICD-10-CM

## 2018-11-16 LAB — NOVEL CORONAVIRUS, NAA: SARS-CoV-2, NAA: NOT DETECTED

## 2019-01-08 ENCOUNTER — Encounter (HOSPITAL_COMMUNITY): Payer: Self-pay | Admitting: Emergency Medicine

## 2019-01-08 ENCOUNTER — Other Ambulatory Visit: Payer: Self-pay

## 2019-01-08 ENCOUNTER — Inpatient Hospital Stay (HOSPITAL_COMMUNITY)
Admission: AD | Admit: 2019-01-08 | Discharge: 2019-01-12 | DRG: 885 | Disposition: A | Discharge status: Home / Self Care | Payer: Self-pay | Source: Intra-hospital | Attending: Psychiatry | Admitting: Psychiatry

## 2019-01-08 ENCOUNTER — Emergency Department (HOSPITAL_COMMUNITY)
Admission: EM | Admit: 2019-01-08 | Discharge: 2019-01-08 | Disposition: A | Discharge status: Other Acute Inpatient Hospital | Payer: Self-pay | Attending: Emergency Medicine | Admitting: Emergency Medicine

## 2019-01-08 DIAGNOSIS — Z915 Personal history of self-harm: Secondary | ICD-10-CM

## 2019-01-08 DIAGNOSIS — F419 Anxiety disorder, unspecified: Secondary | ICD-10-CM | POA: Diagnosis present

## 2019-01-08 DIAGNOSIS — F4310 Post-traumatic stress disorder, unspecified: Secondary | ICD-10-CM | POA: Diagnosis present

## 2019-01-08 DIAGNOSIS — F332 Major depressive disorder, recurrent severe without psychotic features: Secondary | ICD-10-CM | POA: Insufficient documentation

## 2019-01-08 DIAGNOSIS — T50902A Poisoning by unspecified drugs, medicaments and biological substances, intentional self-harm, initial encounter: Secondary | ICD-10-CM

## 2019-01-08 DIAGNOSIS — F4000 Agoraphobia, unspecified: Secondary | ICD-10-CM | POA: Diagnosis present

## 2019-01-08 DIAGNOSIS — Z87891 Personal history of nicotine dependence: Secondary | ICD-10-CM

## 2019-01-08 DIAGNOSIS — Z560 Unemployment, unspecified: Secondary | ICD-10-CM

## 2019-01-08 DIAGNOSIS — Z20828 Contact with and (suspected) exposure to other viral communicable diseases: Secondary | ICD-10-CM | POA: Diagnosis present

## 2019-01-08 DIAGNOSIS — Z79899 Other long term (current) drug therapy: Secondary | ICD-10-CM | POA: Insufficient documentation

## 2019-01-08 DIAGNOSIS — T433X2A Poisoning by phenothiazine antipsychotics and neuroleptics, intentional self-harm, initial encounter: Secondary | ICD-10-CM | POA: Insufficient documentation

## 2019-01-08 DIAGNOSIS — G4700 Insomnia, unspecified: Secondary | ICD-10-CM | POA: Diagnosis present

## 2019-01-08 LAB — HCG, QUANTITATIVE, PREGNANCY: hCG, Beta Chain, Quant, S: 3 m[IU]/mL (ref <5)

## 2019-01-08 LAB — COMPREHENSIVE METABOLIC PANEL
ALT: 17 U/L (ref 0–44)
AST: 21 U/L (ref 15–41)
Albumin: 4.4 g/dL (ref 3.5–5.0)
Alkaline Phosphatase: 62 U/L (ref 38–126)
Anion gap: 10 (ref 5–15)
BUN: 11 mg/dL (ref 6–20)
CO2: 24 mmol/L (ref 22–32)
Calcium: 9.7 mg/dL (ref 8.9–10.3)
Chloride: 103 mmol/L (ref 98–111)
Creatinine, Ser: 0.8 mg/dL (ref 0.44–1.00)
GFR calc Af Amer: >60 mL/min (ref >60)
GFR calc non Af Amer: >60 mL/min (ref >60)
Glucose, Bld: 99 mg/dL (ref 70–99)
Potassium: 3.5 mmol/L (ref 3.5–5.1)
Sodium: 137 mmol/L (ref 135–145)
Total Bilirubin: 0.7 mg/dL (ref 0.3–1.2)
Total Protein: 8.7 g/dL — ABNORMAL HIGH (ref 6.5–8.1)

## 2019-01-08 LAB — CBC WITH DIFFERENTIAL/PLATELET
Abs Immature Granulocytes: 0.08 10*3/uL — ABNORMAL HIGH (ref 0.00–0.07)
Basophils Absolute: 0.1 10*3/uL (ref 0.0–0.1)
Basophils Relative: 0 %
Eosinophils Absolute: 0 10*3/uL (ref 0.0–0.5)
Eosinophils Relative: 0 %
HCT: 41.2 % (ref 36.0–46.0)
Hemoglobin: 13.1 g/dL (ref 12.0–15.0)
Immature Granulocytes: 1 %
Lymphocytes Relative: 4 %
Lymphs Abs: 0.6 10*3/uL — ABNORMAL LOW (ref 0.7–4.0)
MCH: 31 pg (ref 26.0–34.0)
MCHC: 31.8 g/dL (ref 30.0–36.0)
MCV: 97.6 fL (ref 80.0–100.0)
Monocytes Absolute: 0.6 10*3/uL (ref 0.1–1.0)
Monocytes Relative: 4 %
Neutro Abs: 14 10*3/uL — ABNORMAL HIGH (ref 1.7–7.7)
Neutrophils Relative %: 91 %
Platelets: 226 10*3/uL (ref 150–400)
RBC: 4.22 MIL/uL (ref 3.87–5.11)
RDW: 12.4 % (ref 11.5–15.5)
WBC: 15.4 10*3/uL — ABNORMAL HIGH (ref 4.0–10.5)
nRBC: 0 % (ref 0.0–0.2)

## 2019-01-08 LAB — RAPID URINE DRUG SCREEN, HOSP PERFORMED
Amphetamines: NOT DETECTED
Barbiturates: NOT DETECTED
Benzodiazepines: NOT DETECTED
Cocaine: NOT DETECTED
Opiates: NOT DETECTED
Tetrahydrocannabinol: POSITIVE — AB

## 2019-01-08 LAB — SALICYLATE LEVEL: Salicylate Lvl: <7 mg/dL — ABNORMAL LOW (ref 7.0–30.0)

## 2019-01-08 LAB — RESPIRATORY PANEL BY RT PCR (FLU A&B, COVID)
Influenza A by PCR: NEGATIVE
Influenza B by PCR: NEGATIVE
SARS Coronavirus 2 by RT PCR: NEGATIVE

## 2019-01-08 LAB — ETHANOL: Alcohol, Ethyl (B): <10 mg/dL (ref <10)

## 2019-01-08 LAB — ACETAMINOPHEN LEVEL: Acetaminophen (Tylenol), Serum: <10 ug/mL — ABNORMAL LOW (ref 10–30)

## 2019-01-08 MED ORDER — MAGNESIUM HYDROXIDE 400 MG/5ML PO SUSP: 30.0000 mL | Freq: Every day | ORAL | Status: DC | PRN

## 2019-01-08 MED ORDER — ALUM & MAG HYDROXIDE-SIMETH 200-200-20 MG/5ML PO SUSP: 30.0000 mL | ORAL | Status: DC | PRN

## 2019-01-08 MED ORDER — LORAZEPAM 2 MG/ML IJ SOLN
2.0000 mg | Freq: Once | INTRAMUSCULAR | Status: DC
  Filled 2019-01-08: qty 1

## 2019-01-08 MED ORDER — TRAZODONE HCL 50 MG PO TABS: 50.0000 mg | ORAL_TABLET | Freq: Every evening | ORAL | Status: DC | PRN

## 2019-01-08 MED ORDER — ONDANSETRON HCL 4 MG/2ML IJ SOLN
4.0000 mg | Freq: Once | INTRAMUSCULAR | Status: AC
  Administered 2019-01-08: 4 mg via INTRAVENOUS
  Filled 2019-01-08: qty 2

## 2019-01-08 MED ORDER — CHARCOAL ACTIVATED PO LIQD
50.0000 g | Freq: Once | ORAL | Status: AC
  Administered 2019-01-08: 15:00:00 50 g via ORAL
  Filled 2019-01-08: qty 240

## 2019-01-08 MED ORDER — ACETAMINOPHEN 325 MG PO TABS: 650.0000 mg | ORAL_TABLET | Freq: Four times a day (QID) | ORAL | Status: DC | PRN

## 2019-01-08 MED ORDER — SODIUM CHLORIDE 0.9 % IV BOLUS
1000.0000 mL | Freq: Once | INTRAVENOUS | Status: AC
  Administered 2019-01-08: 1000 mL via INTRAVENOUS

## 2019-01-08 NOTE — BHH Counselor (Signed)
At [place], clinician spoke to [facility] to ask if someone could place the TTS cart in pt's room. Clinician called TTS cart a few minutes later, no answer. Clinician to try again.    Vertell Novak, [place], [facility], [facility] Triage Specialist [phone]
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
## 2019-01-08 NOTE — ED Notes (Signed)
TTS cart at bedside. 

## 2019-01-08 NOTE — Progress Notes (Signed)
Received Ashley Kent from the main ED with security. She is stating she wants her clothes and go home. Security was able to calm her down, she was wanded and went to bed.  Attempted to call [facility] without success at 2200 hrs. Report was given to nurse Vaughan Basta at 2222 and [employer] was called for transport. She was transported at 2245 hrs without incident.
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
## 2019-01-08 NOTE — BHH Counselor (Signed)
Clinician spoke to Posey Pronto Nurse expressing the pt can come to [facility] as all IVC documents were received.    Vertell Novak, [place], [facility], [facility] Triage Specialist [phone]
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
## 2019-01-08 NOTE — ED Notes (Signed)
Pt ambulated to/from the restroom with standby assist. Pt was unsteady at first and stated "I feel dizzy", but became more steady after walking a few feet. Pt is back in the bed at this time.

## 2019-01-08 NOTE — BHH Counselor (Signed)
Clinician called TTS cart however no answer. Clinician to try back.    Vertell Novak, MS, [facility], [facility] Triage Specialist [phone].

## 2019-01-08 NOTE — ED Notes (Signed)
This Probation officer contacted poison control and notified Rancour MD of recommendations.
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
## 2019-01-08 NOTE — BHH Counselor (Signed)
Per Larose Kells, RN before pt can come to [facility] a completed copy of the pt's IVC is needed. Discussed with Marykay Lex.    Vertell Novak, [place], [facility], [facility] Triage Specialist [phone]

## 2019-01-09 ENCOUNTER — Encounter (HOSPITAL_COMMUNITY): Payer: Self-pay | Admitting: Behavioral Health

## 2019-01-09 ENCOUNTER — Other Ambulatory Visit: Payer: Self-pay

## 2019-01-09 DIAGNOSIS — F332 Major depressive disorder, recurrent severe without psychotic features: Principal | ICD-10-CM

## 2019-01-09 DIAGNOSIS — T50902A Poisoning by unspecified drugs, medicaments and biological substances, intentional self-harm, initial encounter: Secondary | ICD-10-CM

## 2019-01-09 MED ORDER — LORAZEPAM 0.5 MG PO TABS: 0.5000 mg | ORAL_TABLET | Freq: Four times a day (QID) | ORAL | Status: DC | PRN
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
## 2019-01-09 NOTE — Tx Team (Signed)
Initial Treatment Plan 01/09/2019 1:02 AM [name] ASN:[id number]    PATIENT STRESSORS: Educational concerns Financial difficulties Marital or family conflict   PATIENT STRENGTHS: Active sense of humor Average or above average intelligence Capable of independent living Motivation for treatment/growth   PATIENT IDENTIFIED PROBLEMS:   depression  anxety  Suicide attempt  (I don't have one)             DISCHARGE CRITERIA:  Ability to meet basic life and health needs Adequate post-discharge living arrangements Improved stabilization in mood, thinking, and/or behavior Medical problems require only outpatient monitoring Need for constant or close observation no longer present Verbal commitment to aftercare and medication compliance  PRELIMINARY DISCHARGE PLAN: Attend aftercare/continuing care group Outpatient therapy Participate in family therapy Placement in alternative living arrangements  PATIENT/FAMILY INVOLVEMENT: This treatment plan has been presented to and reviewed with the patient, Ashley Kent,  The patient and family have been given the opportunity to ask questions and make suggestions.  JEHU-APPIAH, Megan Salon, RN 01/09/2019, 1:02 AM
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
## 2019-01-09 NOTE — [facility] Suicide Risk Assessment (Signed)
[facility] INPATIENT:  Family/Significant Other Suicide Prevention Education  Suicide Prevention Education:  Patient Refusal for Family/Significant Other Suicide Prevention Education: The patient Ashley Kent has refused to provide written consent for family/significant other to be provided Family/Significant Other Suicide Prevention Education during admission and/or prior to discharge.  Physician notified.  Trecia Rogers 01/09/2019, 3:17 PM

## 2019-01-09 NOTE — Progress Notes (Signed)
Psychoeducational Group Note  Date:  01/09/2019 Time:  2044  Group Topic/Focus:  Wrap-Up Group:   The focus of this group is to help patients review their daily goal of treatment and discuss progress on daily workbooks.  Participation Level: Did Not Attend  Participation Quality:  Not Applicable  Affect:  Not Applicable  Cognitive:  Not Applicable  Insight:  Not Applicable  Engagement in Group: Not Applicable  Additional Comments:  The patient did not attend group this evening.   Archie Balboa S 01/09/2019, 8:44 PM

## 2019-01-10 NOTE — Progress Notes (Signed)
D. Pt has been in bed this shift, resting with eyes closed with even unlabored respirations.  A.  Will continue to monitor.  R.  Pt remains safe on the unit.

## 2019-01-10 NOTE — [facility] Group Notes (Signed)
Adult Psychoeducational Group Note  Date:  01/10/2019 Time:  9:00 PM  Group Topic/Focus:  Wrap-Up Group:   The focus of this group is to help patients review their daily goal of treatment and discuss progress on daily workbooks.  Participation Level:  Active  Participation Quality:  Appropriate and Attentive  Affect:  Appropriate  Cognitive:  Alert and Appropriate  Insight: Appropriate and Good  Engagement in Group:  Engaged  Modes of Intervention:  Discussion and Education  Additional Comments:  Pt attended and participated in wrap up group this evening. Pt was appropriate in group.   Cristi Loron 01/10/2019, 9:00 PM

## 2019-01-10 NOTE — Plan of Care (Signed)
D: Pt denies SI/HI/AV hallucinations. Pt is pleasant and cooperative. Patient is out in milieu. Patient is calm and cooperative. A: Pt was offered support and encouragement. Q 15 minute checks were done for safety.  R:Patient is interacting with peers and staff. Pt has no complaints.Pt receptive to treatment and safety maintained on unit.    Problem: Self-Concept: Goal: Level of anxiety will decrease Outcome: Progressing
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
## 2019-01-10 NOTE — Progress Notes (Signed)
Recreation Therapy Notes  Date: 12.25.20 Time: 0930-1015 Location: [address] Group Room  Group Topic: Self-Expression  Goal Area(s) Addresses:  Patient will successfully identify positive attributes about themselves.  Patient will successfully identify benefit of improved self-esteem.   Intervention: Statistician, Music  Activity: Christmas Art.  Patients had the option of making Christmas card or any festive Christmas art of their choosing.  Education:  Self-Esteem, Dentist.   Education Outcome: Acknowledges education/In group clarification offered/Needs additional education  Clinical Observations/Feedback: Pt did not participate in activity.     Victorino Sparrow, LRT/CTRS         Victorino Sparrow A 01/10/2019 12:17 PM

## 2019-01-10 NOTE — Progress Notes (Signed)
D:  Patient's self inventory sheet, patient sleeps good, no sleep medication.  Fair appetite, normal energy, good concentration.  Denied depression, hopeless and anxiety.  Denied withdrawals.  Denied SI.  Physical problems, pain, lower stomach..  Goal is discharge.  Plans to talk to MD.  Does have discharge plans. A:  Medications administered per MD orders.  Emotional support and encouragement given patient. R:  Denied SI and HI, contracts for safety.  Denied A/V hallucinations.  Safety maintained with 15 minute checks.

## 2019-01-10 NOTE — Tx Team (Signed)
Interdisciplinary Treatment and Diagnostic Plan Update  01/10/2019 Time of Session: 10:05am Ashley Kent MRN: [id number]  Principal Diagnosis: <principal problem not specified>  Secondary Diagnoses: Active Problems:   MDD (major depressive disorder), recurrent severe, without psychosis (HCC)   Current Medications:  Current Facility-Administered Medications  Medication Dose Route Frequency Provider Last Rate Last Admin  . acetaminophen (TYLENOL) tablet 650 mg  650 mg Oral Q6H PRN Anike, Adaku C, NP      . alum & mag hydroxide-simeth (MAALOX/MYLANTA) 200-200-20 MG/5ML suspension 30 mL  30 mL Oral Q4H PRN Anike, Adaku C, NP      . LORazepam (ATIVAN) tablet 0.5 mg  0.5 mg Oral Q6H PRN Cobos, Fernando A, MD      . magnesium hydroxide (MILK OF MAGNESIA) suspension 30 mL  30 mL Oral Daily PRN Anike, Adaku C, NP      . traZODone (DESYREL) tablet 50 mg  50 mg Oral QHS PRN Anike, Adaku C, NP       PTA Medications: Medications Prior to Admission  Medication Sig Dispense Refill Last Dose  . acetaminophen-codeine (TYLENOL #3) 300-30 MG tablet Take 1 tablet by mouth every 4 (four) hours as needed for moderate pain. (Patient not taking: Reported on 01/08/2019) 10 tablet 0   . dicyclomine (BENTYL) 20 MG tablet Take 1 tablet (20 mg total) by mouth 2 (two) times daily. (Patient not taking: Reported on 01/08/2019) 20 tablet 0   . ibuprofen (ADVIL) 800 MG tablet Take 1 tablet (800 mg total) by mouth every 8 (eight) hours as needed. (Patient not taking: Reported on 01/08/2019) 30 tablet 0   . ondansetron (ZOFRAN ODT) 4 MG disintegrating tablet Take 1 tablet (4 mg total) by mouth every 8 (eight) hours as needed for nausea or vomiting. (Patient not taking: Reported on 01/08/2019) 20 tablet 0   . ondansetron (ZOFRAN-ODT) 4 MG disintegrating tablet Take 1 tablet (4 mg total) by mouth every 8 (eight) hours as needed for nausea or vomiting. (Patient not taking: Reported on 01/08/2019) 12 tablet 0     Patient  Stressors: Educational concerns Financial difficulties Marital or family conflict  Patient Strengths: Active sense of humor Average or above average intelligence Capable of independent living Motivation for treatment/growth  Treatment Modalities: Medication Management, Group therapy, Case management,  1 to 1 session with clinician, Psychoeducation, Recreational therapy.   Physician Treatment Plan for Primary Diagnosis: <principal problem not specified> Long Term Goal(s): Improvement in symptoms so as ready for discharge Improvement in symptoms so as ready for discharge   Short Term Goals: Ability to identify changes in lifestyle to reduce recurrence of condition will improve Ability to verbalize feelings will improve Ability to disclose and discuss suicidal ideas Ability to demonstrate self-control will improve Ability to identify and develop effective coping behaviors will improve Ability to maintain clinical measurements within normal limits will improve Ability to identify changes in lifestyle to reduce recurrence of condition will improve Ability to verbalize feelings will improve Ability to disclose and discuss suicidal ideas Ability to demonstrate self-control will improve Ability to identify and develop effective coping behaviors will improve Ability to maintain clinical measurements within normal limits will improve  Medication Management: Evaluate patient's response, side effects, and tolerance of medication regimen.  Therapeutic Interventions: 1 to 1 sessions, Unit Group sessions and Medication administration.  Evaluation of Outcomes: Progressing  Physician Treatment Plan for Secondary Diagnosis: Active Problems:   MDD (major depressive disorder), recurrent severe, without psychosis (HCC)  Long Term Goal(s): Improvement in symptoms  so as ready for discharge Improvement in symptoms so as ready for discharge   Short Term Goals: Ability to identify changes in  lifestyle to reduce recurrence of condition will improve Ability to verbalize feelings will improve Ability to disclose and discuss suicidal ideas Ability to demonstrate self-control will improve Ability to identify and develop effective coping behaviors will improve Ability to maintain clinical measurements within normal limits will improve Ability to identify changes in lifestyle to reduce recurrence of condition will improve Ability to verbalize feelings will improve Ability to disclose and discuss suicidal ideas Ability to demonstrate self-control will improve Ability to identify and develop effective coping behaviors will improve Ability to maintain clinical measurements within normal limits will improve     Medication Management: Evaluate patient's response, side effects, and tolerance of medication regimen.  Therapeutic Interventions: 1 to 1 sessions, Unit Group sessions and Medication administration.  Evaluation of Outcomes: Progressing   RN Treatment Plan for Primary Diagnosis: <principal problem not specified> Long Term Goal(s): Knowledge of disease and therapeutic regimen to maintain health will improve  Short Term Goals: Ability to participate in decision making will improve, Ability to verbalize feelings will improve, Ability to disclose and discuss suicidal ideas, Ability to identify and develop effective coping behaviors will improve and Compliance with prescribed medications will improve  Medication Management: RN will administer medications as ordered by provider, will assess and evaluate patient's response and provide education to patient for prescribed medication. RN will report any adverse and/or side effects to prescribing provider.  Therapeutic Interventions: 1 on 1 counseling sessions, Psychoeducation, Medication administration, Evaluate responses to treatment, Monitor vital signs and CBGs as ordered, Perform/monitor CIWA, COWS, AIMS and Fall Risk screenings as  ordered, Perform wound care treatments as ordered.  Evaluation of Outcomes: Progressing   LCSW Treatment Plan for Primary Diagnosis: <principal problem not specified> Long Term Goal(s): Safe transition to appropriate next level of care at discharge, Engage patient in therapeutic group addressing interpersonal concerns.  Short Term Goals: Engage patient in aftercare planning with referrals and resources and Increase skills for wellness and recovery  Therapeutic Interventions: Assess for all discharge needs, 1 to 1 time with Social worker, Explore available resources and support systems, Assess for adequacy in community support network, Educate family and significant other(s) on suicide prevention, Complete Psychosocial Assessment, Interpersonal group therapy.  Evaluation of Outcomes: Progressing   Progress in Treatment: Attending groups: No. Participating in groups: No. Taking medication as prescribed: As evidenced by:  Patient is not prescribed any medications at this time Toleration medication: As evidenced by:  Patient is not prescribed any medications at this time Family/Significant other contact made: No, will contact:  Pt declined SPE; with pt Patient understands diagnosis: Yes. Discussing patient identified problems/goals with staff: Yes. Medical problems stabilized or resolved: Yes. Denies suicidal/homicidal ideation: Yes. Issues/concerns per patient self-inventory: No. Other:   New problem(s) identified: No, Describe:  None  New Short Term/Long Term Goal(s): Medication stabilization, elimination of SI thoughts, and development of a comprehensive mental wellness plan.   Patient Goals:    Discharge Plan or Barriers: Patient will be discharging to her Aunt's home. Patient agreed to [facility] aftercare services.   Reason for Continuation of Hospitalization: Depression  Estimated Length of Stay: 2-3 days   Attendees: Patient: 01/10/2019   Physician: Dr. Johnn Hai, MD   01/10/2019   Nursing: Joellen Jersey  01/10/2019   RN Care Manager: 01/10/2019   Social Worker: Ardelle Anton, LCSW  01/10/2019   Recreational Therapist:  01/10/2019   Other:  01/10/2019  Other:  01/10/2019  Other: 01/10/2019     Scribe for Treatment Team: Delphia GratesJasmine M Anastaisa Wooding, LCSW 01/10/2019 11:32 AM

## 2019-01-11 MED ORDER — ESCITALOPRAM OXALATE 5 MG PO TABS
5.0000 mg | ORAL_TABLET | Freq: Every day | ORAL | Status: DC
  Administered 2019-01-11 – 2019-01-12 (×2): 5 mg via ORAL
  Filled 2019-01-11 (×2): qty 1
  Filled 2019-01-11: qty 7
  Filled 2019-01-11 (×3): qty 1
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
## 2019-01-11 NOTE — Progress Notes (Signed)
D. Pt is friendly upon approach- calm and cooperative- observed in the dayroom interacting well with peers. Per pt's self inventory, pt rated her depression, hopelessness and anxiety all 0's.Pt currently denies SI/HI and AVH A. Labs and vitals monitored. Pt compliant with medications. Pt supported emotionally and encouraged to express concerns and ask questions.   R. Pt remains safe with 15 minute checks. Will continue POC.

## 2019-01-12 DIAGNOSIS — T50902A Poisoning by unspecified drugs, medicaments and biological substances, intentional self-harm, initial encounter: Secondary | ICD-10-CM

## 2019-01-12 MED ORDER — ESCITALOPRAM OXALATE 5 MG PO TABS: 5.0000 mg | ORAL_TABLET | Freq: Every day | ORAL | 0 refills | Status: DC

## 2019-01-12 NOTE — Progress Notes (Signed)
Pt discharged to lobby- Aunt present to pick up patient. Pt was stable and appreciative at that time. All papers and prescriptions, samples were given and valuables returned. Verbal understanding expressed. Denies SI/HI and A/VH. Pt given opportunity to express concerns and ask questions.

## 2019-01-12 NOTE — Progress Notes (Signed)
Patient ID: Ashley Kent, female   DOB: [birth year], 23 y.o.   MRN: [id number]   D: Pt calm and cooperative, denies SI/HI/AVH, was visible in the milieu at the beginning of shift interacting with peers.  A:  Q15 minute checks in place for safety, pt denies being in need of any meds for sleep tonight, denies anxiety, no meds scheduled for tonight.  R: Will continue to monitor on Q15 minute checks, pt denies any current concerns and is in bed resting with no signs of distress.

## 2019-01-12 NOTE — [facility] Group Notes (Signed)
[facility] Group Notes:  (Nursing/MHT/Case Management/Adjunct)  Date:  01/12/2019  Time:  1300  Type of Therapy:  Nurse Education-Discussed healthy support systems and created collages.   Participation Level:  Active  Participation Quality:  Appropriate  Affect:  Appropriate  Cognitive:  Appropriate  Insight:  Appropriate  Engagement in Group:  Engaged  Modes of Intervention:  Activity and Discussion  Summary of Progress/Problems:  Marissa Calamity 01/12/2019, 5:11 PM

## 2019-01-12 NOTE — Progress Notes (Signed)
  [facility] Adult Case Management Discharge Plan :  Will you be returning to the same living situation after discharge:  No. Patient will be residing with her aunt. At discharge, do you have transportation home?: Yes,  Aunt will transport. Do you have the ability to pay for your medications: No. Has been referred to [facility] for assistance.  Release of information consent forms completed and emailed to Medical Records, then turned in to Medical Records by CSW.  Patient to Follow up at: Follow-up Information    [facility]. Go to.   Why: Weekday social worker will call for an appointment time, will email you at [email or web address] Monday 12/28 with that information.  Also, [facility] is open Monday-Friday 8am-5pm.  Feel free to go during those hours at your own convenience. Contact information: [address] [place] [id number] [phone]           Next level of care provider has access to [facility] and Suicide Prevention discussed: No.  Have you used any form of tobacco in the last 30 days? (Cigarettes, Smokeless Tobacco, Cigars, and/or Pipes): Yes  Has patient been referred to the Quitline?: Patient refused referral  Patient has been referred for addiction treatment: N/A  Blane Ohara, LCSW 01/12/2019, 9:38 AM

## 2019-01-12 NOTE — [facility] Group Notes (Signed)
[facility] LCSW Group Therapy Note  01/12/2019  10:00-11:00AM  Type of Therapy and Topic:  Group Therapy:  A Hero Worthy of Support  Participation Level:  Active   Description of Group:  Patients in this group were introduced to the concept that additional supports including self-support are an essential part of recovery.  Matching needs with supports to help fulfill those needs was explained.  A song entitled "My Own Hero" was played and a group discussion ensued in which patients stated it inspired them to help themselves in order to succeed, because other people cannot achieve their goals such as sobriety or stability for them.  A song was played called "I Am Enough" which led to a discussion about being willing to believe we are worth the effort of being a self-support.   Therapeutic Goals: 1)  demonstrate the importance of being a key part of one's own support system 2)  discuss various available supports 3)  encourage patient to use music as part of their self-support and focus on goals 4)  elicit ideas from patients about supports that need to be added   Summary of Patient Progress:  The patient expressed that her aunts and cousins are healthy supports because they are consistent, honest, helpful and offer suggestions about right and wrong approaches to problems, while her mother "puts off a negative energy" all the time and is negative toward patient frequently.  Mother frequently is around father, which makes him more negative for her than he would be on his own.  She fully endorsed the idea of being her own hero by reaching out for healthy supports and putting up boundaries with unhealthy ones.  Therapeutic Modalities:   Motivational Interviewing Activity  Maretta Los

## 2019-01-12 NOTE — Progress Notes (Signed)
[place] NOVEL CORONAVIRUS (COVID-19) DAILY CHECK-OFF SYMPTOMS - answer yes or no to each - every day NO YES  Have you had a fever in the past 24 hours?  . Fever (Temp > 37.80C / 100F) X   Have you had any of these symptoms in the past 24 hours? . New Cough .  Sore Throat  .  Shortness of Breath .  Difficulty Breathing .  Unexplained Body Aches   X   Have you had any one of these symptoms in the past 24 hours not related to allergies?   . Runny Nose .  Nasal Congestion .  Sneezing   X   If you have had runny nose, nasal congestion, sneezing in the past 24 hours, has it worsened?  X   EXPOSURES - check yes or no X   Have you traveled outside the state in the past 14 days?  X   Have you been in contact with someone with a confirmed diagnosis of COVID-19 or PUI in the past 14 days without wearing appropriate PPE?  X   Have you been living in the same home as a person with confirmed diagnosis of COVID-19 or a PUI (household contact)?    X   Have you been diagnosed with COVID-19?    X              What to do next: Answered NO to all: Answered YES to anything:   Proceed with unit schedule Follow the [facility] Inpatient Flowsheet.   
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
## 2019-01-12 NOTE — Progress Notes (Signed)
   01/12/19 0900  Psych Admission Type (Psych Patients Only)  Admission Status Involuntary  Psychosocial Assessment  Patient Complaints None  Eye Contact Fair  Facial Expression Anxious  Affect Appropriate to circumstance  Speech Logical/coherent  Interaction Assertive  Motor Activity Other (Comment) (steady gait)  Appearance/Hygiene Unremarkable  Behavior Characteristics Cooperative;Appropriate to situation  Mood Pleasant  Thought Process  Coherency WDL  Content WDL  Delusions None reported or observed  Perception WDL  Hallucination None reported or observed  Judgment Impaired  Confusion None  Danger to Self  Current suicidal ideation? Denies  Danger to Others  Danger to Others None reported or observed
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
## 2019-02-25 ENCOUNTER — Emergency Department (HOSPITAL_COMMUNITY)
Admission: EM | Admit: 2019-02-25 | Discharge: 2019-02-25 | Disposition: A | Discharge status: Home / Self Care | Payer: BC Managed Care – PPO | Attending: Emergency Medicine | Admitting: Emergency Medicine

## 2019-02-25 ENCOUNTER — Other Ambulatory Visit: Payer: Self-pay

## 2019-02-25 DIAGNOSIS — F17210 Nicotine dependence, cigarettes, uncomplicated: Secondary | ICD-10-CM | POA: Diagnosis not present

## 2019-02-25 DIAGNOSIS — L0291 Cutaneous abscess, unspecified: Secondary | ICD-10-CM

## 2019-02-25 DIAGNOSIS — N764 Abscess of vulva: Secondary | ICD-10-CM | POA: Diagnosis present

## 2019-02-25 MED ORDER — HYDROMORPHONE HCL 1 MG/ML IJ SOLN
1.0000 mg | Freq: Once | INTRAMUSCULAR | Status: AC
  Administered 2019-02-25: 1 mg via INTRAMUSCULAR
  Filled 2019-02-25: qty 1

## 2019-02-25 MED ORDER — LIDOCAINE HCL 2 % IJ SOLN
20.0000 mL | Freq: Once | INTRAMUSCULAR | Status: AC
  Administered 2019-02-25: 400 mg
  Filled 2019-02-25: qty 20

## 2019-02-25 MED ORDER — TRAMADOL HCL 50 MG PO TABS: 50.0000 mg | ORAL_TABLET | Freq: Four times a day (QID) | ORAL | 0 refills | Status: DC | PRN

## 2019-02-25 MED ORDER — DOXYCYCLINE HYCLATE 100 MG PO CAPS: 100.0000 mg | ORAL_CAPSULE | Freq: Two times a day (BID) | ORAL | 0 refills | Status: DC

## 2019-02-25 NOTE — Discharge Instructions (Addendum)
Return here as needed. Follow up with the clinic provided as needed. Warm bath soaks.

## 2019-02-25 NOTE — ED Triage Notes (Signed)
Pt arrives POV with c/o of vagina abscess which began to develop yesterday.   Pt tried warm compresses with no relief. 8/10 pain.

## 2019-03-01 NOTE — ED Provider Notes (Signed)
[facility] EMERGENCY DEPARTMENT Provider Note   CSN: [id number] Arrival date & time: 02/25/19  8242     History Chief Complaint  Patient presents with  . Abscess    Katelyn Patel is a 24 y.o. female.  HPI Patient presents to the emergency department with an abscess to the left upper labia.  The patient states that she was concerned because she felt like it may be due to the fact that she uses sex toys vigorously.  Patient states that it started after she felt like she irritated the labia with a sex toy.  Patient states that thing seems make the condition better but palpation makes the pain worse.  Patient denies any fever, nausea, vomiting, weakness, dizziness, headache, blurred vision, abdominal pain or syncope.    No past medical history on file.  There are no problems to display for this patient.   No past surgical history on file.   OB History   No obstetric history on file.     No family history on file.  Social History   Tobacco Use  . Smoking status: Current Every Day Smoker  . Smokeless tobacco: Never Used  Substance Use Topics  . Alcohol use: Not Currently  . Drug use: Not Currently    Home Medications Prior to Admission medications   Medication Sig Start Date End Date Taking? Authorizing Provider  doxycycline (VIBRAMYCIN) 100 MG capsule Take 1 capsule (100 mg total) by mouth 2 (two) times daily. 02/25/19   Nycholas Rayner, Harrell Gave, PA-C  metoCLOPramide (REGLAN) 10 MG tablet Take 1 tablet (10 mg total) by mouth every 6 (six) hours as needed for nausea or vomiting. Patient not taking: Reported on 02/22/2018 05/21/17   Wurst, [place], PA-C  traMADol (ULTRAM) 50 MG tablet Take 1 tablet (50 mg total) by mouth every 6 (six) hours as needed for severe pain. 02/25/19   Travon Crochet, Harrell Gave, PA-C    Allergies    Watermelon flavor  Review of Systems   Review of Systems All other systems negative except as documented in the HPI. All pertinent positives  and negatives as reviewed in the HPI. Physical Exam Updated Vital Signs BP 125/70 (BP Location: Right Arm)   Pulse 97   Temp 98.5 F (36.9 C) (Oral)   Resp 17   Ht 5\' 7"  (1.702 m)   Wt 70.3 kg   SpO2 100%   BMI 24.28 kg/m   Physical Exam Vitals and nursing note reviewed.  Constitutional:      General: She is not in acute distress.    Appearance: She is well-developed.  HENT:     Head: Normocephalic and atraumatic.  Eyes:     Pupils: Pupils are equal, round, and reactive to light.  Cardiovascular:     Rate and Rhythm: Normal rate and regular rhythm.     Heart sounds: Normal heart sounds. No murmur. No friction rub. No gallop.   Pulmonary:     Effort: Pulmonary effort is normal. No respiratory distress.     Breath sounds: Normal breath sounds. No wheezing.  Genitourinary:   Musculoskeletal:     Cervical back: Normal range of motion and neck supple.  Skin:    General: Skin is warm and dry.     Capillary Refill: Capillary refill takes less than 2 seconds.     Findings: No erythema or rash.  Neurological:     Mental Status: She is alert and oriented to person, place, and time.     Motor: No  abnormal muscle tone.     Coordination: Coordination normal.  Psychiatric:        Behavior: Behavior normal.     ED Results / Procedures / Treatments   Labs (all labs ordered are listed, but only abnormal results are displayed) Labs Reviewed - No data to display  EKG None  Radiology No results found.  Procedures Procedures (including critical care time)  Medications Ordered in ED Medications  HYDROmorphone (DILAUDID) injection 1 mg (1 mg Intramuscular Given 02/25/19 0942)  lidocaine (XYLOCAINE) 2 % (with pres) injection 400 mg (400 mg Infiltration Given 02/25/19 0943)    ED Course  I have reviewed the triage vital signs and the nursing notes.  Pertinent labs & imaging results that were available during my care of the patient were reviewed by me and considered in my  medical decision making (see chart for details).    MDM Rules/Calculators/A&P                      INCISION AND DRAINAGE Performed by: Jamesetta Orleans Lataisha Colan Consent: Verbal consent obtained. Risks and benefits: risks, benefits and alternatives were discussed Type: abscess  Body area: Left upper labia  Anesthesia: local infiltration  Incision was made with a scalpel.  Local anesthetic: lidocaine 2% without epinephrine  Anesthetic total: 6 ml  Complexity: complex Blunt dissection to break up loculations  Drainage: purulent  Drainage amount: moderate  Packing material: 1/4 in iodoform gauze  Patient tolerance: Patient tolerated the procedure well with no immediate complications.    Final Clinical Impression(s) / ED Diagnoses Final diagnoses:  Abscess    Rx / DC Orders ED Discharge Orders         Ordered    doxycycline (VIBRAMYCIN) 100 MG capsule  2 times daily     02/25/19 1206    traMADol (ULTRAM) 50 MG tablet  Every 6 hours PRN     02/25/19 1206           Charlestine Night, PA-C 03/01/19 2311    Pricilla Loveless, MD 03/05/19 1510

## 2019-06-13 ENCOUNTER — Other Ambulatory Visit: Payer: Self-pay

## 2019-06-13 ENCOUNTER — Encounter (HOSPITAL_COMMUNITY): Payer: Self-pay

## 2019-06-13 ENCOUNTER — Emergency Department (HOSPITAL_COMMUNITY)
Admission: EM | Admit: 2019-06-13 | Discharge: 2019-06-14 | Disposition: A | Discharge status: Home / Self Care | Payer: BC Managed Care – PPO | Attending: Emergency Medicine | Admitting: Emergency Medicine

## 2019-06-13 DIAGNOSIS — N751 Abscess of Bartholin's gland: Secondary | ICD-10-CM | POA: Diagnosis not present

## 2019-06-13 DIAGNOSIS — Z79899 Other long term (current) drug therapy: Secondary | ICD-10-CM | POA: Insufficient documentation

## 2019-06-13 DIAGNOSIS — N898 Other specified noninflammatory disorders of vagina: Secondary | ICD-10-CM | POA: Diagnosis present

## 2019-06-13 DIAGNOSIS — F17210 Nicotine dependence, cigarettes, uncomplicated: Secondary | ICD-10-CM | POA: Insufficient documentation

## 2019-06-13 MED ORDER — LIDOCAINE-EPINEPHRINE (PF) 2 %-1:200000 IJ SOLN
10.0000 mL | Freq: Once | INTRAMUSCULAR | Status: AC
  Administered 2019-06-13: 10 mL
  Filled 2019-06-13: qty 20

## 2019-06-13 MED ORDER — OXYCODONE-ACETAMINOPHEN 5-325 MG PO TABS
1.0000 | ORAL_TABLET | Freq: Once | ORAL | Status: AC
  Administered 2019-06-13: 1 via ORAL
  Filled 2019-06-13: qty 1

## 2019-06-13 NOTE — ED Triage Notes (Signed)
Pt reports that she has an abscess on her vagina since yesterday with some drainage, no fevers.

## 2019-06-13 NOTE — ED Provider Notes (Signed)
[facility] EMERGENCY DEPARTMENT Provider Note   CSN: [id number] Arrival date & time: 06/13/19  2113     History Chief Complaint  Patient presents with  . Abscess    Katelyn Patel is a 24 y.o. female.  Patient to ED with painful swelling to external vagina that started yesterday. No fever. No vaginal discharge, dysuria, nausea. Today she started applying warm compresses and the area opened and has been draining purulent material all day. History of bartholin's cysts/abscesses.   The history is provided by the patient. No language interpreter was used.       History reviewed. No pertinent past medical history.  There are no problems to display for this patient.   History reviewed. No pertinent surgical history.   OB History   No obstetric history on file.     No family history on file.  Social History   Tobacco Use  . Smoking status: Current Every Day Smoker  . Smokeless tobacco: Never Used  Substance Use Topics  . Alcohol use: Not Currently  . Drug use: Not Currently    Home Medications Prior to Admission medications   Medication Sig Start Date End Date Taking? Authorizing Provider  doxycycline (VIBRAMYCIN) 100 MG capsule Take 1 capsule (100 mg total) by mouth 2 (two) times daily. 02/25/19   Lawyer, Cristal Deer, PA-C  metoCLOPramide (REGLAN) 10 MG tablet Take 1 tablet (10 mg total) by mouth every 6 (six) hours as needed for nausea or vomiting. Patient not taking: Reported on 02/22/2018 05/21/17   Wurst, [place], PA-C  traMADol (ULTRAM) 50 MG tablet Take 1 tablet (50 mg total) by mouth every 6 (six) hours as needed for severe pain. 02/25/19   Lawyer, Cristal Deer, PA-C    Allergies    Watermelon flavor  Review of Systems   Review of Systems  Constitutional: Negative for fever.  Genitourinary: Positive for vaginal pain. Negative for dysuria and vaginal discharge.       See HPI.  Skin: Positive for wound.       See HPI.    Physical  Exam Updated Vital Signs BP 136/85 (BP Location: Right Arm)   Pulse (!) 112   Temp 98.3 F (36.8 C) (Oral)   Resp 15   Ht 5\' 7"  (1.702 m)   Wt 70.3 kg   SpO2 99%   BMI 24.27 kg/m   Physical Exam Constitutional:      Appearance: Normal appearance.  Pulmonary:     Effort: Pulmonary effort is normal.  Abdominal:     Tenderness: There is no abdominal tenderness.  Genitourinary:    Comments: Significant swelling to left labia with fluctuance on vaginal wall c/w bartholin's abscess. No visualized drainage. Right labia unremarkable.  Skin:    General: Skin is warm and dry.  Neurological:     Mental Status: She is alert and oriented to person, place, and time.     ED Results / Procedures / Treatments   Labs (all labs ordered are listed, but only abnormal results are displayed) Labs Reviewed - No data to display  EKG None  Radiology No results found.  Procedures . Incision and Drainage  Date/Time: 06/14/2019 12:49 AM Performed by: 06/16/2019, PA-C Authorized by: Elpidio Anis, PA-C   Consent:    Consent obtained:  Verbal   Consent given by:  Patient Location:    Type:  Abscess   Location:  Anogenital   Anogenital location:  Bartholin's gland Pre-procedure details:    Skin preparation:  Antiseptic wash  Anesthesia (see MAR for exact dosages):    Anesthesia method:  Local infiltration   Local anesthetic:  Lidocaine 2% WITH epi Procedure type:    Complexity:  Simple Procedure details:    Needle aspiration: no     Incision types:  Stab incision   Scalpel blade:  11   Wound management:  Probed and deloculated   Drainage:  Bloody and purulent   Drainage amount:  Moderate   Wound treatment:  Wound left open Post-procedure details:    Patient tolerance of procedure:  Tolerated well, no immediate complications   (including critical care time)  Medications Ordered in ED Medications  lidocaine-EPINEPHrine (XYLOCAINE W/EPI) 2 %-1:200000 (PF) injection 10 mL  (10 mLs Infiltration Given 06/13/19 2312)  oxyCODONE-acetaminophen (PERCOCET/ROXICET) 5-325 MG per tablet 1 tablet (1 tablet Oral Given 06/13/19 2312)    ED Course  I have reviewed the triage vital signs and the nursing notes.  Pertinent labs & imaging results that were available during my care of the patient were reviewed by me and considered in my medical decision making (see chart for details).    MDM Rules/Calculators/A&P                      Patient to ED with recurrent Bartholin's abscess, last one about 1 year ago. No other symptoms.   Exam findings c/w Bartholin's abscess. I&D per above procedure note with good drainage.   Will provide pain management and encourage GYN recheck next week. Referral provided.   Final Clinical Impression(s) / ED Diagnoses Final diagnoses:  None   1. Bartholin's abscess   Rx / DC Orders ED Discharge Orders    None       Charlann Lange, PA-C 06/14/19 0051    Malvin Johns, MD 06/14/19 [phone]

## 2019-06-14 MED ORDER — HYDROCODONE-ACETAMINOPHEN 7.5-325 MG PO TABS: 1.0000 | ORAL_TABLET | Freq: Four times a day (QID) | ORAL | 0 refills | Status: DC | PRN

## 2019-06-14 MED ORDER — IBUPROFEN 600 MG PO TABS: 600.0000 mg | ORAL_TABLET | Freq: Four times a day (QID) | ORAL | 0 refills | Status: DC | PRN

## 2019-06-14 NOTE — Discharge Instructions (Signed)
Take Percocet for pain as directed.   Please follow up with gynecology for recheck of recurrent abscess. Return to the emergency department with any new or worsening symptoms.

## 2019-07-07 ENCOUNTER — Other Ambulatory Visit: Payer: Self-pay

## 2019-07-07 ENCOUNTER — Ambulatory Visit
Admission: EM | Admit: 2019-07-07 | Discharge: 2019-07-07 | Disposition: A | Discharge status: Home / Self Care | Payer: Self-pay | Attending: Emergency Medicine | Admitting: Emergency Medicine

## 2019-07-07 DIAGNOSIS — N926 Irregular menstruation, unspecified: Secondary | ICD-10-CM | POA: Insufficient documentation

## 2019-07-07 DIAGNOSIS — Z3202 Encounter for pregnancy test, result negative: Secondary | ICD-10-CM | POA: Insufficient documentation

## 2019-07-07 DIAGNOSIS — R110 Nausea: Secondary | ICD-10-CM | POA: Insufficient documentation

## 2019-07-07 LAB — PREGNANCY, URINE: Preg Test, Ur: NEGATIVE

## 2019-07-07 MED ORDER — ONDANSETRON 8 MG PO TBDP: 8.0000 mg | ORAL_TABLET | Freq: Three times a day (TID) | ORAL | 0 refills | Status: DC | PRN

## 2019-07-07 NOTE — Discharge Instructions (Signed)
-  Zofran: 1 tablet dissolved under the tongue every 8 hours as needed for nausea -Urine pregnancy test was negative -Ensure adequate hydration and nutrition -follow up with OB/GYN

## 2019-07-07 NOTE — ED Provider Notes (Signed)
[facility]    CSN: [id number] Arrival date & time: 07/07/19  1208      History   Chief Complaint Chief Complaint  Patient presents with  . Possible Pregnancy    HPI Ashley Kent is a 24 y.o. female.   Patient is a 24 year old female who presents with complaint of missed period x2.  Patient requesting a pregnancy test.  Patient reports nausea most mornings and has had some mood swings.  Her last menstrual period was April 8.  Patient reports some minor cramps around the time that her cycle should have been last month but not since.  Patient reports that her cycles are normal and regular.  She states that her normal cycle April she had a sexual encounter towards the end of the month and that she had some spotting shortly after that but no spotting since then.     Past Medical History:  Diagnosis Date  . Anxiety   . Depression     Patient Active Problem List   Diagnosis Date Noted  . Intentional drug overdose (HCC)   . 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 No pertinent surgical history.  OB History    Gravida  1   Para      Term      Preterm      AB      Living        SAB      TAB      Ectopic      Multiple      Live Births               Home Medications    Prior to Admission medications   Medication Sig Start Date End Date Taking? Authorizing Provider  escitalopram (LEXAPRO) 5 MG tablet Take 1 tablet (5 mg total) by mouth daily. 01/13/19   Aldean Baker, NP  ondansetron (ZOFRAN ODT) 8 MG disintegrating tablet Take 1 tablet (8 mg total) by mouth every 8 (eight) hours as needed for nausea or vomiting. 07/07/19   Candis Schatz, PA-C    Family History Family History  Problem Relation Age of Onset  . Stroke Other   . Alcoholism Maternal Uncle     Social  History Social History   Tobacco Use  . Smoking status: Current Some Day Smoker    Types: Cigars  . Smokeless tobacco: Never Used  Vaping Use  . Vaping Use: Never used  Substance Use Topics  . Alcohol use: No  . Drug use: Yes    Frequency: 1.0 times per week    Types: Marijuana     Allergies   Orange (diagnostic), Sunscreens, and Zyrtec [cetirizine]   Review of Systems Review of Systems as noted above in HPI.  Other systems reviewed and found to be negative   Physical Exam Triage Vital Signs ED Triage Vitals  Enc Vitals Group     BP 07/07/19 1242 138/78     Pulse Rate 07/07/19 1242 79     Resp 07/07/19 1242 15     Temp 07/07/19 1242 98.4 F (36.9 C)     Temp Source 07/07/19 1242 Oral     SpO2 07/07/19 1242 100 %     Weight 07/07/19 1244 150 lb (68 kg)     Height 07/07/19 1244 5\' 4"  (1.626 m)     Head  Circumference --      Peak Flow --      Pain Score 07/07/19 1242 0     Pain Loc --      Pain Edu? --      Excl. in [place]? --    No data found.  Updated Vital Signs BP 138/78 (BP Location: Left Arm)   Pulse 79   Temp 98.4 F (36.9 C) (Oral)   Resp 15   Ht 5\' 4"  (1.626 m)   Wt 150 lb (68 kg)   LMP 04/24/2018   SpO2 100%   BMI 25.75 kg/m    Physical Exam Constitutional:      General: She is not in acute distress.    Appearance: Normal appearance. She is not ill-appearing.  Eyes:     Pupils: Pupils are equal, round, and reactive to light.  Cardiovascular:     Rate and Rhythm: Normal rate and regular rhythm.     Pulses: Normal pulses.     Heart sounds: Normal heart sounds.  Pulmonary:     Effort: Pulmonary effort is normal.     Breath sounds: Normal breath sounds. No wheezing.  Abdominal:     General: Abdomen is flat. Bowel sounds are normal. There is no distension.     Palpations: Abdomen is soft.     Tenderness: There is no abdominal tenderness. There is no guarding.  Skin:    Capillary Refill: Capillary refill takes less than 2 seconds.   Neurological:     General: No focal deficit present.     Mental Status: She is alert.      UC Treatments / Results  Labs (all labs ordered are listed, but only abnormal results are displayed) Labs Reviewed  PREGNANCY, URINE    EKG   Radiology No results found.  Procedures Procedures (including critical care time)  Medications Ordered in UC Medications - No data to display  Initial Impression / Assessment and Plan / UC Course  I have reviewed the triage vital signs and the nursing notes.  Pertinent labs & imaging results that were available during my care of the patient were reviewed by me and considered in my medical decision making (see chart for details).     Urine pregnancy test negative.  We will give her prescription for Zofran.  Have her ensure adequate fluid intake and good nutrition.  We will have her follow-up with OB/GYN. Final Clinical Impressions(s) / UC Diagnoses   Final diagnoses:  Encounter for pregnancy test with result negative  Nausea  Missed period     Discharge Instructions     -Zofran: 1 tablet dissolved under the tongue every 8 hours as needed for nausea -Urine pregnancy test was negative -Ensure adequate hydration and nutrition -follow up with OB/GYN    ED Prescriptions    Medication Sig Dispense Auth. Provider   ondansetron (ZOFRAN ODT) 8 MG disintegrating tablet Take 1 tablet (8 mg total) by mouth every 8 (eight) hours as needed for nausea or vomiting. 20 tablet Luvenia Redden, PA-C     PDMP not reviewed this encounter.   Luvenia Redden, PA-C 07/07/19 1411

## 2019-11-23 ENCOUNTER — Ambulatory Visit
Admission: EM | Admit: 2019-11-23 | Discharge: 2019-11-23 | Disposition: A | Discharge status: Home / Self Care | Payer: Medicaid Other | Attending: Family Medicine | Admitting: Family Medicine

## 2019-11-23 ENCOUNTER — Other Ambulatory Visit: Payer: Self-pay

## 2019-11-23 ENCOUNTER — Encounter: Payer: Self-pay | Admitting: Emergency Medicine

## 2019-11-23 DIAGNOSIS — R220 Localized swelling, mass and lump, head: Secondary | ICD-10-CM

## 2019-11-23 MED ORDER — PREDNISONE 50 MG PO TABS: ORAL_TABLET | ORAL | 0 refills | Status: AC

## 2019-11-23 NOTE — Discharge Instructions (Signed)
Medication as prescribed.  Consider see an Allergist - You may need a referral from your primary provider (whoever is on your insurance card). Local allergist - [facility] in [place] --> Phone [phone]  Take care  Dr. Adriana Simas

## 2019-11-23 NOTE — ED Triage Notes (Signed)
Patient c/o lip swelling that started last night.  Patient denies itching or rash.  Patient denies difficulty breathing or swallowing.

## 2019-11-23 NOTE — ED Provider Notes (Signed)
[facility]    CSN: [id number] Arrival date & time: 11/23/19  8546  History   Chief Complaint Chief Complaint  Patient presents with  . Oral Swelling   HPI  24 year old female presents with lip swelling.  Started last night.  She states that she has had this previously.  No known inciting factor.  She denies any recent changes.  She takes no medications regular.  No tongue swelling or difficulty swallowing.  Patient states that she is allergic to antihistamines.  She has not taken any medication for this.  No itching.  She states that her lips do tingle.  No other associated symptoms.  No other complaints.  Past Medical History:  Diagnosis Date  . Anxiety   . Depression     Patient Active Problem List   Diagnosis Date Noted  . Intentional drug overdose (HCC)   . 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 No pertinent surgical history.  OB History    Gravida  1   Para      Term      Preterm      AB      Living        SAB      TAB      Ectopic      Multiple      Live Births               Home Medications    Prior to Admission medications   Medication Sig Start Date End Date Taking? Authorizing Provider  predniSONE (DELTASONE) 50 MG tablet 1 tablet daily x 5 days 11/23/19   Everlene Other G, DO  escitalopram (LEXAPRO) 5 MG tablet Take 1 tablet (5 mg total) by mouth daily. 01/13/19 11/23/19  Aldean Baker, NP    Family History Family History  Problem Relation Age of Onset  . Stroke Other   . Alcoholism Maternal Uncle     Social History Social History   Tobacco Use  . Smoking status: Current Some Day Smoker    Types: Cigars  . Smokeless tobacco: Never Used  Vaping Use  . Vaping Use: Never used  Substance Use Topics  . Alcohol use: No  . Drug use: Yes    Frequency: 1.0  times per week    Types: Marijuana     Allergies   Orange (diagnostic), Sunscreens, and Zyrtec [cetirizine]   Review of Systems Review of Systems  HENT:       Lip swelling.   Physical Exam Triage Vital Signs ED Triage Vitals  Enc Vitals Group     BP 11/23/19 1129 130/86     Pulse Rate 11/23/19 1129 87     Resp 11/23/19 1129 14     Temp 11/23/19 1129 98.4 F (36.9 C)     Temp Source 11/23/19 1129 Oral     SpO2 11/23/19 1129 100 %     Weight 11/23/19 1126 150 lb (68 kg)     Height 11/23/19 1126 5\' 5"  (1.651 m)     Head Circumference --      Peak Flow --      Pain Score 11/23/19 1126 0     Pain Loc --      Pain Edu? --      Excl. in [place]? --    Updated Vital Signs BP 130/86 (BP Location: Right  Arm)   Pulse 87   Temp 98.4 F (36.9 C) (Oral)   Resp 14   Ht 5\' 5"  (1.651 m)   Wt 68 kg   LMP 11/21/2019 (Exact Date)   SpO2 100%   BMI 24.96 kg/m   Visual Acuity Right Eye Distance:   Left Eye Distance:   Bilateral Distance:    Right Eye Near:   Left Eye Near:    Bilateral Near:     Physical Exam Vitals and nursing note reviewed.  Constitutional:      General: She is not in acute distress.    Appearance: Normal appearance. She is not ill-appearing.  HENT:     Head: Normocephalic and atraumatic.     Mouth/Throat:     Comments: No appreciable lip swelling on exam.  No tongue swelling. Cardiovascular:     Rate and Rhythm: Normal rate and regular rhythm.     Heart sounds: No murmur heard.   Pulmonary:     Effort: Pulmonary effort is normal.     Breath sounds: Normal breath sounds. No wheezing or rales.  Neurological:     Mental Status: She is alert.  Psychiatric:        Mood and Affect: Mood normal.        Behavior: Behavior normal.    UC Treatments / Results  Labs (all labs ordered are listed, but only abnormal results are displayed) Labs Reviewed - No data to display  EKG   Radiology No results found.  Procedures Procedures (including  critical care time)  Medications Ordered in UC Medications - No data to display  Initial Impression / Assessment and Plan / UC Course  I have reviewed the triage vital signs and the nursing notes.  Pertinent labs & imaging results that were available during my care of the patient were reviewed by me and considered in my medical decision making (see chart for details).    24 year old female presents with lip swelling.  Exam is essentially unremarkable.  Placing on prednisone as patient cannot tolerate antihistamines.  Advised to see allergist.  Supportive care.  Final Clinical Impressions(s) / UC Diagnoses   Final diagnoses:  Lip swelling     Discharge Instructions     Medication as prescribed.  Consider see an Allergist - You may need a referral from your primary provider (whoever is on your insurance card). Local allergist - [facility] in [place] --> Phone [phone]  Take care  Dr. [phone]     ED Prescriptions    Medication Sig Dispense Auth. Provider   predniSONE (DELTASONE) 50 MG tablet 1 tablet daily x 5 days 5 tablet Adriana Simas G, DO     PDMP not reviewed this encounter.   Everlene Other, Tommie Sams 11/23/19 1315

## 2020-03-08 NOTE — Progress Notes (Signed)
 [place] Department of Health notified of results per regulations

## 2021-06-23 IMAGING — US US OB COMP LESS 14 WK
1 series · 15 of 28 positions shown · non-contrast
Comparison: None.

CLINICAL DATA: Vaginal bleeding

EXAM:
OBSTETRIC <14 WK ULTRASOUND
TECHNIQUE: Transabdominal ultrasound was performed for evaluation of the
gestation as well as the maternal uterus and adnexal regions.

[Series 1: us ob comp less 14 wk · 47 acquisitions, 15 frames shown]
[im 1/47]
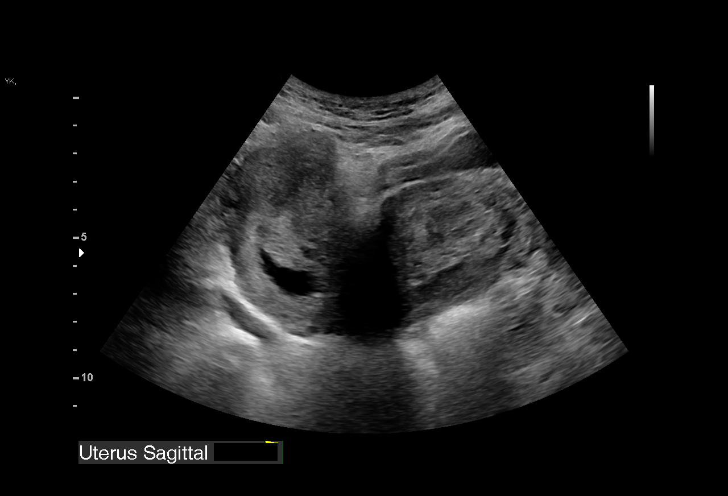
[im 4/47]
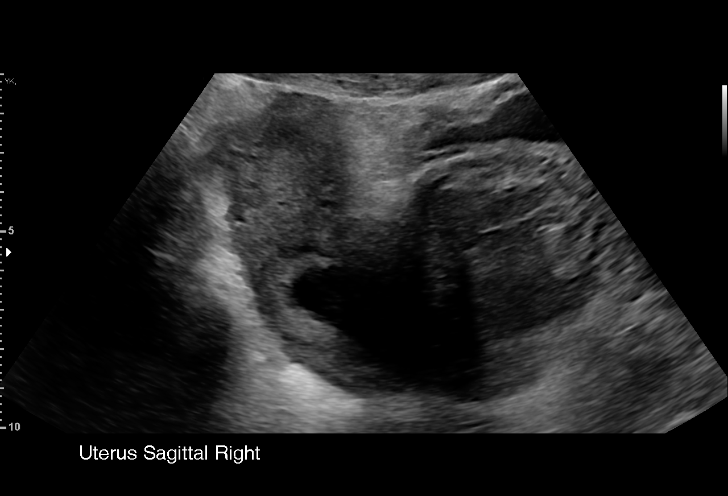
[im 7/47]
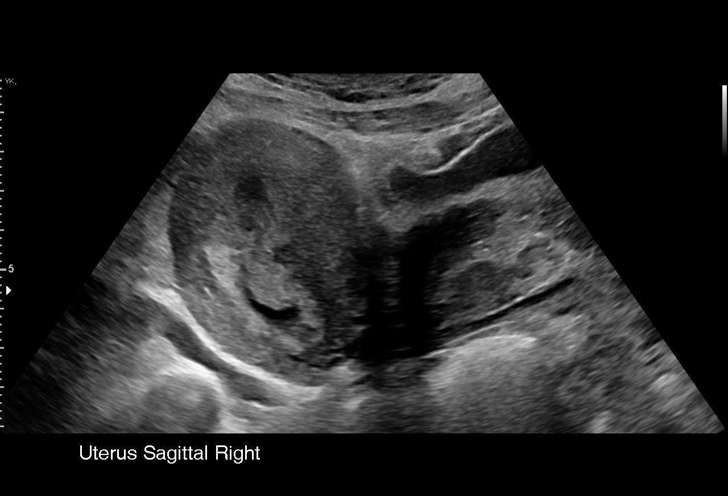
[im 11/47]
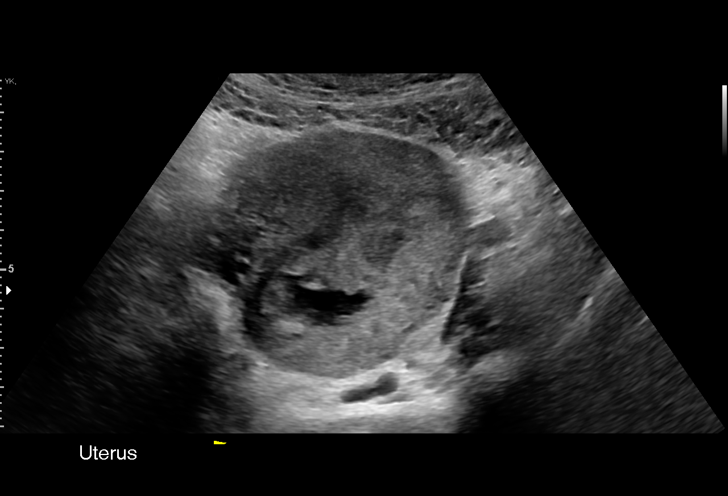
[im 14/47]
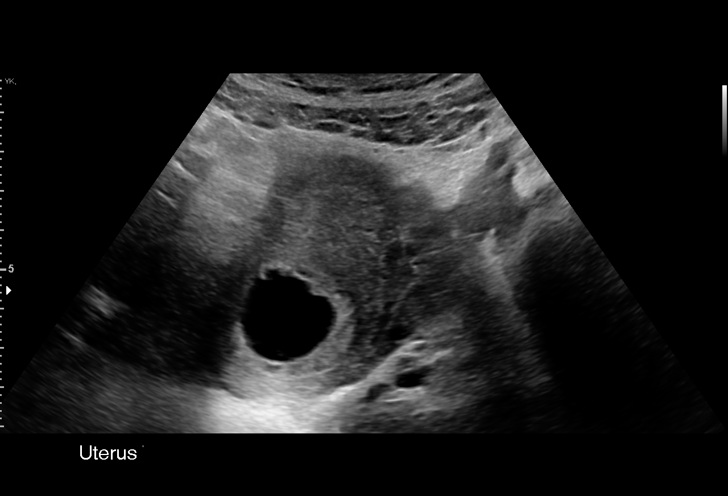
[im 18/47]
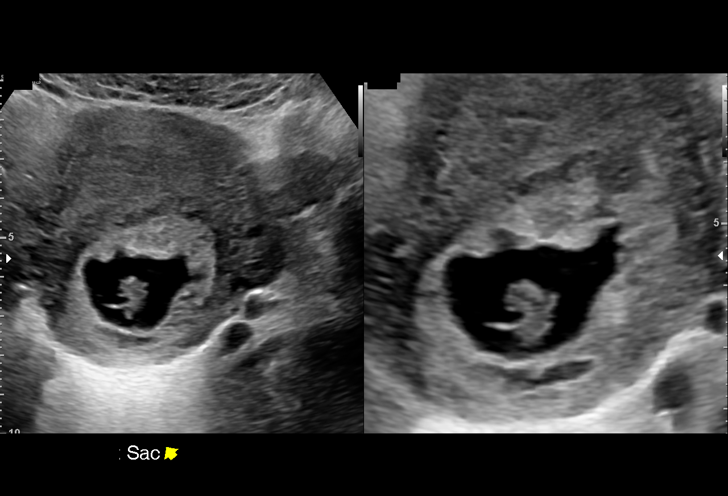
[im 21/47]
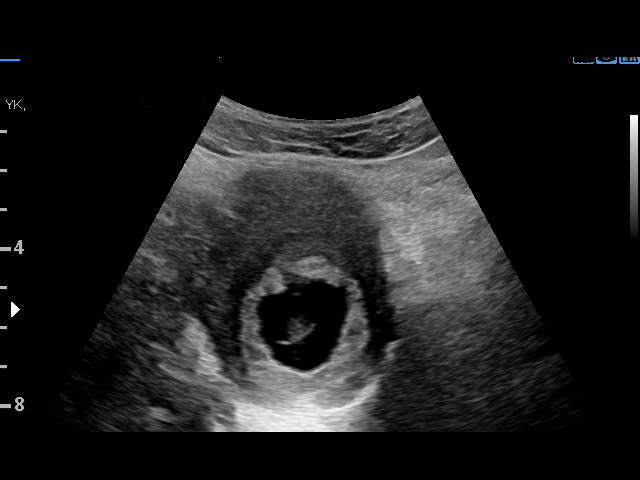
[im 24/47]
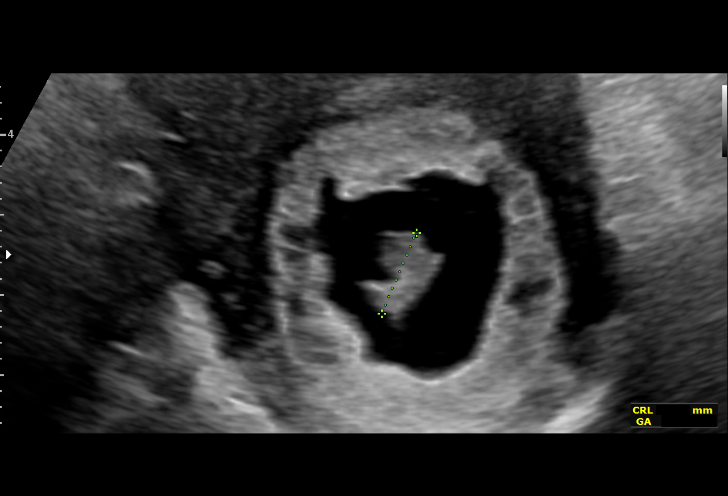
[im 26/47]
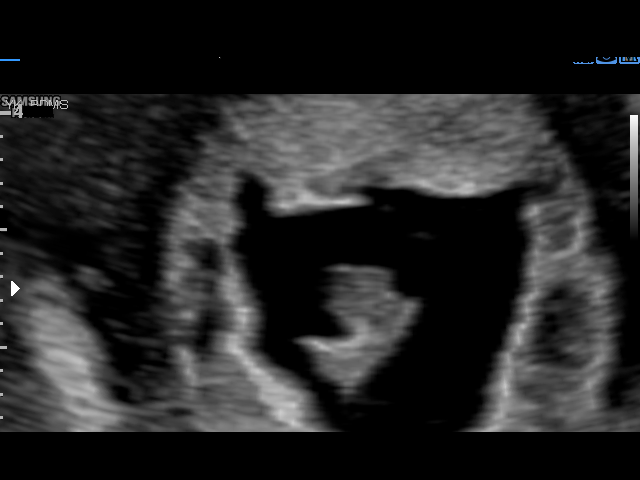
[im 29/47]
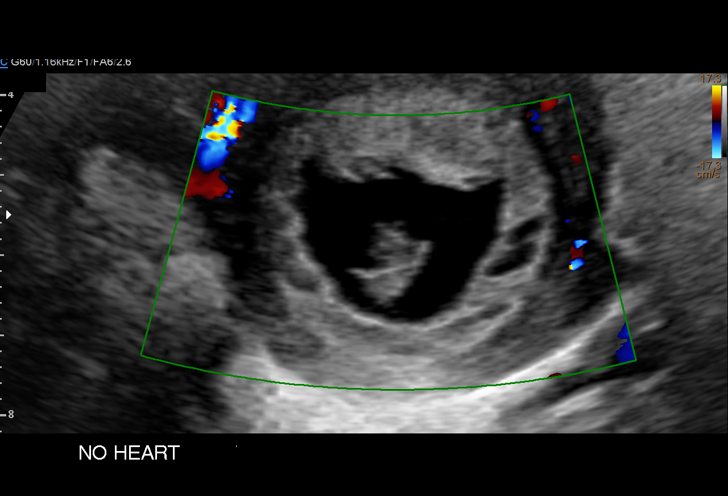
[im 33/47]
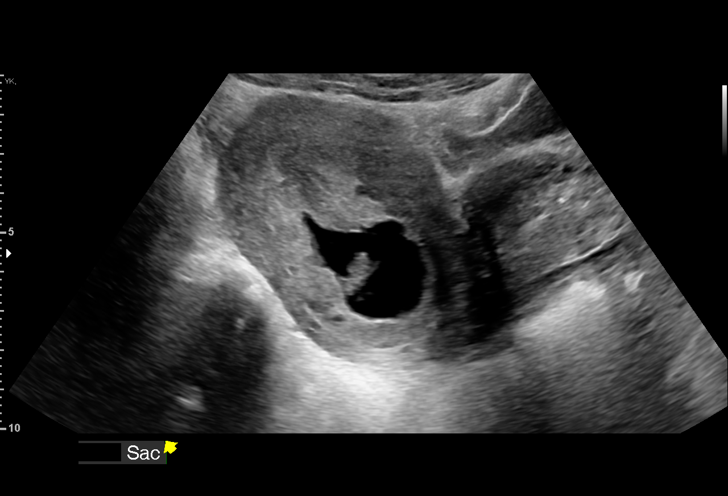
[im 36/47]
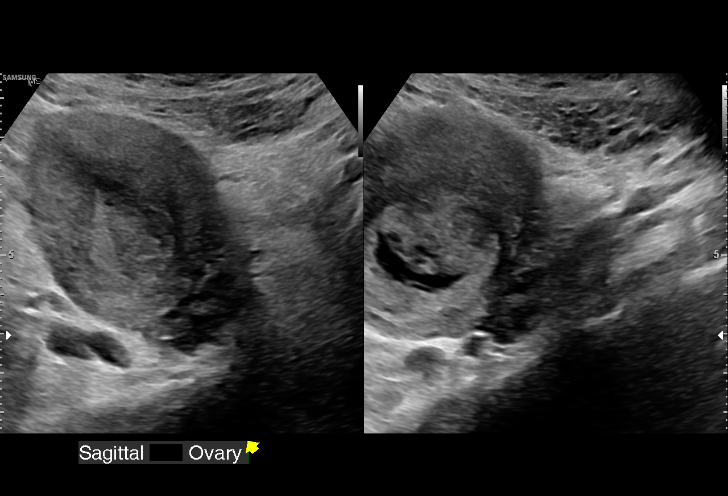
[im 40/47]
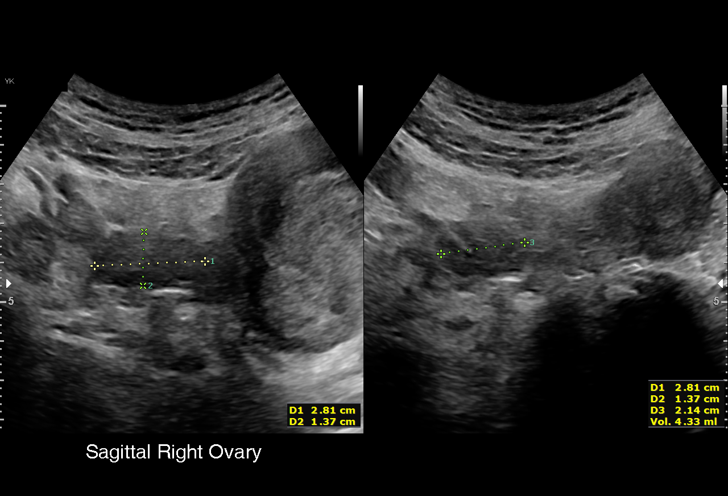
[im 43/47]
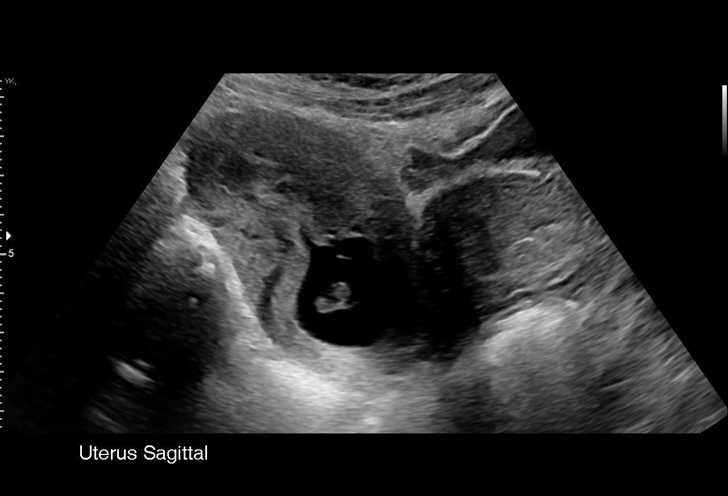
[im 47/47]
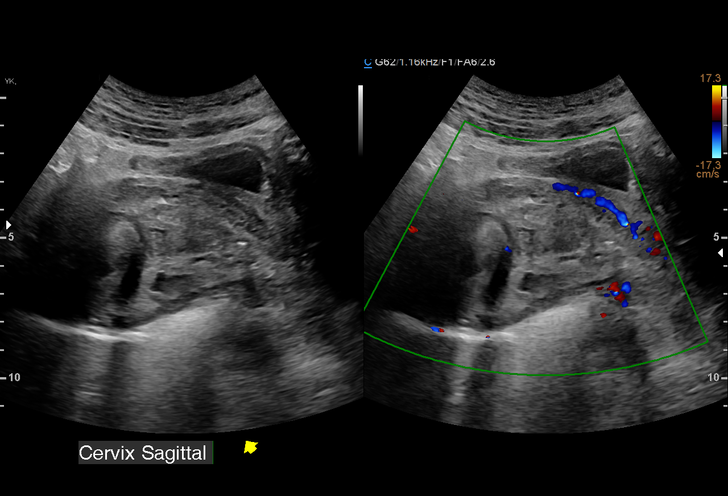

[15 of 28 positions shown; findings below may reference images not displayed]

FINDINGS: Intrauterine gestational sac: Present but somewhat irregular

Yolk sac:  Absent

Embryo:  Present

Cardiac Activity: Absent

CRL:   11.7 mm mm   7 w 2 d

Subchorionic hemorrhage:  Small subchorionic hemorrhage is noted.

Maternal uterus/adnexae: Ovaries are within normal limits.
IMPRESSION: Intrauterine gestation with absent cardiac activity. Findings meet
definitive criteria for failed pregnancy. This follows SRU consensus
guidelines: Diagnostic Criteria for Nonviable Pregnancy Early in the
First Trimester. N Engl J Med 1890;[DATE].

## 2021-08-02 ENCOUNTER — Encounter (HOSPITAL_COMMUNITY): Payer: Self-pay

## 2021-08-02 ENCOUNTER — Ambulatory Visit (HOSPITAL_COMMUNITY)
Admission: EM | Admit: 2021-08-02 | Discharge: 2021-08-02 | Disposition: A | Discharge status: Home / Self Care | Payer: BC Managed Care – PPO | Attending: Internal Medicine | Admitting: Internal Medicine

## 2021-08-02 DIAGNOSIS — S63501A Unspecified sprain of right wrist, initial encounter: Secondary | ICD-10-CM

## 2021-08-02 MED ORDER — IBUPROFEN 800 MG PO TABS
ORAL_TABLET | ORAL | Status: AC
  Filled 2021-08-02: qty 1

## 2021-08-02 MED ORDER — NAPROXEN 500 MG PO TABS: 500.0000 mg | ORAL_TABLET | Freq: Two times a day (BID) | ORAL | 0 refills | Status: DC

## 2021-08-02 MED ORDER — IBUPROFEN 800 MG PO TABS
800.0000 mg | ORAL_TABLET | Freq: Once | ORAL | Status: AC
  Administered 2021-08-02: 800 mg via ORAL

## 2021-08-02 MED ORDER — ACETAMINOPHEN 500 MG PO TABS: 1000.0000 mg | ORAL_TABLET | Freq: Four times a day (QID) | ORAL | 0 refills | Status: DC | PRN

## 2021-08-02 NOTE — Discharge Instructions (Addendum)
You sprained your right wrist. Wear the wrist brace provided at urgent care for the next couple of weeks to stabilize your right wrist and provide comfort and compression to reduce swelling and pain.  We gave you 800 mg of ibuprofen in the clinic today.  You may start taking naproxen tonight at 9:30 PM with some applesauce or something like to eat.  Do not take this medicine on an empty stomach frequently as it can cause stomach upset. Take naproxen twice a day for the next 2-3 days consistently, then as needed after that to reduce inflammation and pain to your right wrist due to the sprain.  Call the orthopedic doctor listed on your paperwork if your wrist pain has not improved over the next 1 to 2 weeks.

## 2021-08-02 NOTE — ED Provider Notes (Signed)
[facility]    CSN: [id number] Arrival date & time: 08/02/21  1232      History   Chief Complaint Chief Complaint  Patient presents with   Wrist Pain    HPI Katelyn Patel is a 26 y.o. female.   Patient presents to urgent care for evaluation of right wrist pain that started 1 week ago after she was at her job working at [employer] and she hit her arm on a gallon tub of mayonnaise.  Patient states that after she hit her arm on the mayonnaise jar, she hit her arm on the wall on accident.  She states that she is "accident prone".  There was a bit of swelling to the ulnar aspect of the right distal wrist and patient states that there was a bruise to the posterior distal ulnar aspect of the right wrist that has since improved.  Patient denies taking any medication for her pain prior to arrival urgent care.  Pain to the right wrist is currently a 5 on a scale of 0-10.  Patient denies pain to the right elbow and right hand.  No numbness or tingling to the right distal upper extremity.  She did not hit her head and is not on blood thinners.  No dizziness.  No other aggravating relieving factors identified for patient's symptoms at this time.   Wrist Pain    History reviewed. No pertinent past medical history.  There are no problems to display for this patient.   History reviewed. No pertinent surgical history.  OB History   No obstetric history on file.      Home Medications    Prior to Admission medications   Medication Sig Start Date End Date Taking? Authorizing Provider  acetaminophen (TYLENOL) 500 MG tablet Take 2 tablets (1,000 mg total) by mouth every 6 (six) hours as needed. 08/02/21  Yes Carlisle Beers, FNP  naproxen (NAPROSYN) 500 MG tablet Take 1 tablet (500 mg total) by mouth 2 (two) times daily. 08/02/21  Yes Carlisle Beers, FNP    Family History History reviewed. No pertinent family history.  Social History Social History    Tobacco Use   Smoking status: Every Day   Smokeless tobacco: Never  Substance Use Topics   Alcohol use: Not Currently   Drug use: Not Currently     Allergies   Watermelon flavor   Review of Systems Review of Systems Per HPI  Physical Exam Triage Vital Signs ED Triage Vitals [08/02/21 1317]  Enc Vitals Group     BP      Pulse      Resp      Temp 98.6 F (37 C)     Temp src      SpO2      Weight      Height      Head Circumference      Peak Flow      Pain Score 5     Pain Loc      Pain Edu?      Excl. in GC?    No data found.  Updated Vital Signs BP 128/83 (BP Location: Left Arm)   Pulse 64   Temp 98.6 F (37 C) (Oral)   Resp 18   LMP 07/19/2021   SpO2 98%   Visual Acuity Right Eye Distance:   Left Eye Distance:   Bilateral Distance:    Right Eye Near:   Left Eye Near:  Bilateral Near:     Physical Exam Vitals and nursing note reviewed.  Constitutional:      Appearance: Normal appearance. She is not ill-appearing or toxic-appearing.     Comments: Very pleasant patient sitting on exam in position of comfort table in no acute distress.   HENT:     Head: Normocephalic and atraumatic.     Right Ear: Hearing and external ear normal.     Left Ear: Hearing and external ear normal.     Nose: Nose normal.     Mouth/Throat:     Lips: Pink.     Mouth: Mucous membranes are moist.  Eyes:     General: Lids are normal. Vision grossly intact. Gaze aligned appropriately.     Extraocular Movements: Extraocular movements intact.     Conjunctiva/sclera: Conjunctivae normal.  Pulmonary:     Effort: Pulmonary effort is normal.  Abdominal:     Palpations: Abdomen is soft.  Musculoskeletal:     Left wrist: Normal.     Cervical back: Neck supple.     Comments: Mild swelling to the right wrist is present.  No ecchymosis or deformity present.  Range of motion to the right wrist is normal and not limited by tenderness.  Patient is tender to the distal ulnar  aspect of the right wrist with palpation.  No crepitus with palpation.  5/5 grip strength to the right upper extremity.  Sensation is intact.  Capillary refill less than 3.  No skin discoloration present.  Skin:    General: Skin is warm and dry.     Capillary Refill: Capillary refill takes less than 2 seconds.     Findings: No rash.  Neurological:     General: No focal deficit present.     Mental Status: She is alert and oriented to person, place, and time. Mental status is at baseline.     Cranial Nerves: No dysarthria or facial asymmetry.     Gait: Gait is intact.  Psychiatric:        Mood and Affect: Mood normal.        Speech: Speech normal.        Behavior: Behavior normal.        Thought Content: Thought content normal.        Judgment: Judgment normal.      UC Treatments / Results  Labs (all labs ordered are listed, but only abnormal results are displayed) Labs Reviewed - No data to display  EKG   Radiology No results found.  Procedures Procedures (including critical care time)  Medications Ordered in UC Medications  ibuprofen (ADVIL) tablet 800 mg (has no administration in time range)    Initial Impression / Assessment and Plan / UC Course  I have reviewed the triage vital signs and the nursing notes.  Pertinent labs & imaging results that were available during my care of the patient were reviewed by me and considered in my medical decision making (see chart for details).  1.  Sprain of right wrist Symptomology and physical exam are consistent with acute sprain of the right wrist.  No clinical indication for imaging at this time as patient's musculoskeletal exam to the right wrist is stable and without decreased range of motion or strength.  Patient placed in right wrist brace and given walking referral to orthopedics.  She may also follow-up with urgent care as needed.  Patient given 800 mg of ibuprofen in the clinic to treat wrist pain and inflammation.  She  is  to take naproxen 500 mg twice daily for the next 2 to 3 days, then as needed.  Naproxen with food recommended starting tonight at 9:30 PM since she was given ibuprofen 800 mg in the clinic.  She is to rest, ice, compress, and elevate her right upper extremity over the next few days to reduce swelling and inflammation.  Work note given.   Discussed physical exam and available lab work findings in clinic with patient.  Counseled patient regarding appropriate use of medications and potential side effects for all medications recommended or prescribed today. Discussed red flag signs and symptoms of worsening condition,when to call the PCP office, return to urgent care, and when to seek higher level of care in the emergency department. Patient verbalizes understanding and agreement with plan. All questions answered. Patient discharged in stable condition.  Final Clinical Impressions(s) / UC Diagnoses   Final diagnoses:  Sprain of right wrist, initial encounter     Discharge Instructions      You sprained your right wrist. Wear the wrist brace provided at urgent care for the next couple of weeks to stabilize your right wrist and provide comfort and compression to reduce swelling and pain.  We gave you 800 mg of ibuprofen in the clinic today.  You may start taking naproxen tonight at 9:30 PM with some applesauce or something like to eat.  Do not take this medicine on an empty stomach frequently as it can cause stomach upset. Take naproxen twice a day for the next 2-3 days consistently, then as needed after that to reduce inflammation and pain to your right wrist due to the sprain.  Call the orthopedic doctor listed on your paperwork if your wrist pain has not improved over the next 1 to 2 weeks.    ED Prescriptions     Medication Sig Dispense Auth. Provider   naproxen (NAPROSYN) 500 MG tablet Take 1 tablet (500 mg total) by mouth 2 (two) times daily. 30 tablet Joella Prince M, FNP    acetaminophen (TYLENOL) 500 MG tablet Take 2 tablets (1,000 mg total) by mouth every 6 (six) hours as needed. 30 tablet Talbot Grumbling, FNP      PDMP not reviewed this encounter.   Talbot Grumbling, [place] 08/02/21 1355

## 2021-08-02 NOTE — ED Triage Notes (Signed)
Pt c/o rt wrist pain for over a week. States hit it on the [employer] container at work and then hit it multiple times on other things. Denies taking any meds.

## 2022-03-10 ENCOUNTER — Emergency Department (HOSPITAL_COMMUNITY)
Admission: EM | Admit: 2022-03-10 | Discharge: 2022-03-10 | Disposition: A | Discharge status: Home / Self Care | Payer: Self-pay | Attending: Emergency Medicine | Admitting: Emergency Medicine

## 2022-03-10 DIAGNOSIS — N751 Abscess of Bartholin's gland: Secondary | ICD-10-CM | POA: Insufficient documentation

## 2022-03-10 DIAGNOSIS — N758 Other diseases of Bartholin's gland: Secondary | ICD-10-CM

## 2022-03-10 MED ORDER — CLINDAMYCIN HCL 300 MG PO CAPS: 300.0000 mg | ORAL_CAPSULE | Freq: Four times a day (QID) | ORAL | 0 refills | Status: AC

## 2022-03-10 MED ORDER — HYDROCODONE-ACETAMINOPHEN 5-325 MG PO TABS: 1.0000 | ORAL_TABLET | ORAL | 0 refills | Status: DC | PRN

## 2022-03-10 MED ORDER — HYDROCODONE-ACETAMINOPHEN 5-325 MG PO TABS
1.0000 | ORAL_TABLET | Freq: Once | ORAL | Status: AC
  Administered 2022-03-10: 1 via ORAL
  Filled 2022-03-10: qty 1

## 2022-03-10 NOTE — ED Provider Notes (Signed)
[facility] Provider Note   CSN: [id number] Arrival date & time: 03/10/22  2135     History  Chief Complaint  Patient presents with   Cyst    Katelyn Patel is a 27 y.o. female.   27 y.o. female  was evaluated in triage.  Pt complains of left labia cyst x few days with drainage. History of bartholins requiring I&D in the past. Soaked in warm water which was painful and seemed to make cyst worse (opened and drained).       Home Medications Prior to Admission medications   Medication Sig Start Date End Date Taking? Authorizing Provider  clindamycin (CLEOCIN) 300 MG capsule Take 1 capsule (300 mg total) by mouth 4 (four) times daily for 10 days. 03/10/22 03/20/22 Yes Tacy Learn, PA-C  HYDROcodone-acetaminophen (NORCO/VICODIN) 5-325 MG tablet Take 1 tablet by mouth every 4 (four) hours as needed. 03/10/22  Yes Tacy Learn, PA-C  naproxen sodium (ALEVE) 220 MG tablet Take 220 mg by mouth daily as needed (For pain).   Yes [provider]  acetaminophen (TYLENOL) 500 MG tablet Take 2 tablets (1,000 mg total) by mouth every 6 (six) hours as needed. Patient not taking: Reported on 03/10/2022 08/02/21   Talbot Grumbling, FNP  naproxen (NAPROSYN) 500 MG tablet Take 1 tablet (500 mg total) by mouth 2 (two) times daily. Patient not taking: Reported on 03/10/2022 08/02/21   Talbot Grumbling, FNP      Allergies    Watermelon flavor    Review of Systems   Review of Systems Negative except as per HPI Physical Exam Updated Vital Signs BP (!) 115/99   Pulse (!) 102   Temp 99.7 F (37.6 C) (Oral)   Resp 18   Ht '5\' 7"'$  (1.702 m)   Wt 81.6 kg   LMP 02/16/2022   SpO2 100%   BMI 28.19 kg/m  Physical Exam Vitals and nursing note reviewed. Exam conducted with a chaperone present.  Constitutional:      General: She is not in acute distress.    Appearance: She is well-developed. She is not diaphoretic.  HENT:     Head:  Normocephalic and atraumatic.  Pulmonary:     Effort: Pulmonary effort is normal.  Genitourinary:    Comments: Suspect left side bartholins gland abscess which has since drained, no fluctuance or drainage noted from from swollen left labia area Skin:    General: Skin is warm and dry.     Findings: No erythema or rash.  Neurological:     Mental Status: She is alert and oriented to person, place, and time.  Psychiatric:        Behavior: Behavior normal.     ED Results / Procedures / Treatments   Labs (all labs ordered are listed, but only abnormal results are displayed) Labs Reviewed - No data to display  EKG None  Radiology No results found.  Procedures Procedures    Medications Ordered in ED Medications  HYDROcodone-acetaminophen (NORCO/VICODIN) 5-325 MG per tablet 1 tablet (1 tablet Oral Given 03/10/22 2242)    ED Course/ Medical Decision Making/ A&P                             Medical Decision Making Risk Prescription drug management.   27 year old female with concern for left labia swelling, history of bartholin gland abscess in the past. Soaked in warm  water at home, area became larger and drained. On exam, no active drainage or fluctuance at this time. Recommend warm compresses, will cover with clindamycin, norco for pain, referral to GYN for recheck in 2 days with return to ER precautions.         Final Clinical Impression(s) / ED Diagnoses Final diagnoses:  Bartholin's gland infection    Rx / DC Orders ED Discharge Orders          Ordered    clindamycin (CLEOCIN) 300 MG capsule  4 times daily        03/10/22 2235    HYDROcodone-acetaminophen (NORCO/VICODIN) 5-325 MG tablet  Every 4 hours PRN        03/10/22 2235              Tacy Learn, PA-C 03/10/22 2244    Lajean Saver, MD 03/10/22 2315

## 2022-03-10 NOTE — Discharge Instructions (Addendum)
Antibiotics as prescribed and complete the full course. Norco as needed for pain as directed. DO NOT drive while taking this medication.   You can make an appointment to see a GYN provider:   [facility] at [place]  [phone]   [facility] [place] [facility]  [place]  [phone]   [facility] [place] at [facility]  [phone]   [facility] [facility]  [place]  ([place] [facility] at [place]  [phone]   If you already have an established OB/GYN provider in the area you can make an appointment with them as well.

## 2022-03-10 NOTE — ED Triage Notes (Signed)
Pt to ED c/o vaginal cyst , noticed it a couple of days ago, hx of the same. Reports draining.

## 2022-03-10 NOTE — ED Provider Triage Note (Signed)
Emergency Medicine Provider Triage Evaluation Note  Katelyn Patel , a 27 y.o. female  was evaluated in triage.  Pt complains of left labia cyst x few days with drainage.  Review of Systems  Positive: Labia cyst with drainage Negative: fever  Physical Exam  BP (!) 115/99   Pulse (!) 102   Temp 99.7 F (37.6 C) (Oral)   Resp 18   Ht '5\' 7"'$  (1.702 m)   Wt 81.6 kg   LMP 02/16/2022   SpO2 100%   BMI 28.19 kg/m  Gen:   Awake, no distress   Resp:  Normal effort  MSK:   Moves extremities without difficulty  Other:    Medical Decision Making  Medically screening exam initiated at 10:27 PM.  Appropriate orders placed.  Tierney Wolden was informed that the remainder of the evaluation will be completed by another provider, this initial triage assessment does not replace that evaluation, and the importance of remaining in the ED until their evaluation is complete.     Tacy Learn, PA-C 03/10/22 2227

## 2022-07-21 ENCOUNTER — Encounter (HOSPITAL_COMMUNITY): Payer: Self-pay | Admitting: Emergency Medicine

## 2022-07-21 ENCOUNTER — Ambulatory Visit (HOSPITAL_COMMUNITY)
Admission: EM | Admit: 2022-07-21 | Discharge: 2022-07-21 | Disposition: A | Discharge status: Home / Self Care | Payer: Self-pay | Attending: Nurse Practitioner | Admitting: Nurse Practitioner

## 2022-07-21 DIAGNOSIS — Z1152 Encounter for screening for COVID-19: Secondary | ICD-10-CM | POA: Insufficient documentation

## 2022-07-21 DIAGNOSIS — J069 Acute upper respiratory infection, unspecified: Secondary | ICD-10-CM | POA: Insufficient documentation

## 2022-07-21 LAB — SARS CORONAVIRUS 2 (TAT 6-24 HRS): SARS Coronavirus 2: NEGATIVE

## 2022-07-21 MED ORDER — ONDANSETRON 4 MG PO TBDP: 4.0000 mg | ORAL_TABLET | Freq: Three times a day (TID) | ORAL | 0 refills | Status: AC | PRN

## 2022-07-21 NOTE — ED Provider Notes (Signed)
[facility]    CSN: [id number] Arrival date & time: 07/21/22  1200      History   Chief Complaint Chief Complaint  Patient presents with   Chills   Cough    HPI Katelyn Patel is a 27 y.o. female.   Patient presents today for 12-hour history of dry cough, runny and stuffy nose, sore throat, eye pain, nausea without vomiting, diarrhea, decreased appetite, and fatigue.  She denies known fevers, body aches or chills, congested cough, shortness of breath or chest pain, headache, ear pain, abdominal pain, vomiting, and loss of taste or smell.  Reports she has had some chest tightness since she woke up at random times throughout the day.  Reports her roommate is sick with similar symptoms.  Has not take anything for symptoms so far.  Denies history of asthma or any chronic lung disease.    History reviewed. No pertinent past medical history.  There are no problems to display for this patient.   History reviewed. No pertinent surgical history.  OB History   No obstetric history on file.      Home Medications    Prior to Admission medications   Medication Sig Start Date End Date Taking? Authorizing Provider  ondansetron (ZOFRAN-ODT) 4 MG disintegrating tablet Take 1 tablet (4 mg total) by mouth every 8 (eight) hours as needed for nausea or vomiting. 07/21/22  Yes Valentino Nose, NP    Family History No family history on file.  Social History Social History   Tobacco Use   Smoking status: Every Day   Smokeless tobacco: Never  Substance Use Topics   Alcohol use: Not Currently   Drug use: Not Currently     Allergies   Watermelon flavor   Review of Systems Review of Systems Per HPI  Physical Exam Triage Vital Signs ED Triage Vitals  Enc Vitals Group     BP 07/21/22 1244 114/75     Pulse Rate 07/21/22 1244 66     Resp 07/21/22 1244 16     Temp 07/21/22 1244 98.4 F (36.9 C)     Temp Source 07/21/22 1244 Oral     SpO2 07/21/22 1244 98 %      Weight --      Height --      Head Circumference --      Peak Flow --      Pain Score 07/21/22 1243 6     Pain Loc --      Pain Edu? --      Excl. in GC? --    No data found.  Updated Vital Signs BP 114/75 (BP Location: Right Arm)   Pulse 66   Temp 98.4 F (36.9 C) (Oral)   Resp 16   LMP 06/28/2022   SpO2 98%   Visual Acuity Right Eye Distance:   Left Eye Distance:   Bilateral Distance:    Right Eye Near:   Left Eye Near:    Bilateral Near:     Physical Exam Vitals and nursing note reviewed.  Constitutional:      General: She is not in acute distress.    Appearance: Normal appearance. She is not ill-appearing or toxic-appearing.  HENT:     Head: Normocephalic and atraumatic.     Right Ear: Tympanic membrane, ear canal and external ear normal.     Left Ear: Tympanic membrane, ear canal and external ear normal.     Nose: Congestion and rhinorrhea present.  Mouth/Throat:     Mouth: Mucous membranes are moist.     Pharynx: Oropharynx is clear. No oropharyngeal exudate or posterior oropharyngeal erythema.  Eyes:     General: No scleral icterus.    Extraocular Movements: Extraocular movements intact.  Cardiovascular:     Rate and Rhythm: Normal rate and regular rhythm.  Pulmonary:     Effort: Pulmonary effort is normal. No respiratory distress.     Breath sounds: Normal breath sounds. No wheezing, rhonchi or rales.  Abdominal:     General: Abdomen is flat. Bowel sounds are normal. There is no distension.     Palpations: Abdomen is soft.  Musculoskeletal:     Cervical back: Normal range of motion and neck supple.  Lymphadenopathy:     Cervical: No cervical adenopathy.  Skin:    General: Skin is warm and dry.     Coloration: Skin is not jaundiced or pale.     Findings: No erythema or rash.  Neurological:     Mental Status: She is alert and oriented to person, place, and time.  Psychiatric:        Behavior: Behavior is cooperative.      UC  Treatments / Results  Labs (all labs ordered are listed, but only abnormal results are displayed) Labs Reviewed  SARS CORONAVIRUS 2 (TAT 6-24 HRS)    EKG   Radiology No results found.  Procedures Procedures (including critical care time)  Medications Ordered in UC Medications - No data to display  Initial Impression / Assessment and Plan / UC Course  I have reviewed the triage vital signs and the nursing notes.  Pertinent labs & imaging results that were available during my care of the patient were reviewed by me and considered in my medical decision making (see chart for details).   Patient is well-appearing, normotensive, afebrile, not tachycardic, not tachypneic, oxygenating well on room air.    1. Viral URI with cough 2. Encounter for screening for COVID-19 Suspect viral etiology Vitals and exam today are reassuring COVID-19 testing obtained Supportive care discussed; start Zofran for nausea/vomiting ER and return precautions also discussed Note given for work  The patient was given the opportunity to ask questions.  All questions answered to their satisfaction.  The patient is in agreement to this plan.    Final Clinical Impressions(s) / UC Diagnoses   Final diagnoses:  Viral URI with cough  Encounter for screening for COVID-19     Discharge Instructions      You have a viral upper respiratory infection.  Symptoms should improve over the next week to 10 days.  If you develop chest pain or shortness of breath, go to the emergency room.  We have tested you today for COVID-19.  You will see the results in Mychart and we will call you with positive results.  Please stay home and isolate until you are aware of the results.    Some things that can make you feel better are: - Increased rest - Increasing fluid with water/sugar free electrolytes - Acetaminophen and ibuprofen as needed for fever/pain - Salt water gargling, chloraseptic spray and throat lozenges -  OTC guaifenesin (Mucinex) 600 mg twice daily for congestion - Saline sinus flushes or a neti pot - Humidifying the air - Zofran as needed for nausea/vomiting     ED Prescriptions     Medication Sig Dispense Auth. Provider   ondansetron (ZOFRAN-ODT) 4 MG disintegrating tablet Take 1 tablet (4 mg total) by mouth every  8 (eight) hours as needed for nausea or vomiting. 20 tablet Valentino Nose, NP      PDMP not reviewed this encounter.   Valentino Nose, NP 07/21/22 1356

## 2022-07-21 NOTE — Discharge Instructions (Addendum)
You have a viral upper respiratory infection.  Symptoms should improve over the next week to 10 days.  If you develop chest pain or shortness of breath, go to the emergency room.  We have tested you today for COVID-19.  You will see the results in Mychart and we will call you with positive results.  Please stay home and isolate until you are aware of the results.    Some things that can make you feel better are: - Increased rest - Increasing fluid with water/sugar free electrolytes - Acetaminophen and ibuprofen as needed for fever/pain - Salt water gargling, chloraseptic spray and throat lozenges - OTC guaifenesin (Mucinex) 600 mg twice daily for congestion - Saline sinus flushes or a neti pot - Humidifying the air - Zofran as needed for nausea/vomiting

## 2022-07-21 NOTE — ED Triage Notes (Signed)
Pt having chills and hot spells, eyes hurt, headaches, dry cough, sneezing that started at midnight. Hasn't taken any medications.

## 2023-08-01 ENCOUNTER — Emergency Department (HOSPITAL_COMMUNITY)
Admission: EM | Admit: 2023-08-01 | Discharge: 2023-08-02 | Disposition: A | Discharge status: Home / Self Care | Attending: Emergency Medicine | Admitting: Emergency Medicine

## 2023-08-01 ENCOUNTER — Emergency Department (HOSPITAL_COMMUNITY)

## 2023-08-01 ENCOUNTER — Encounter (HOSPITAL_COMMUNITY): Payer: Self-pay | Admitting: Emergency Medicine

## 2023-08-01 ENCOUNTER — Other Ambulatory Visit: Payer: Self-pay

## 2023-08-01 DIAGNOSIS — S0012XA Contusion of left eyelid and periocular area, initial encounter: Secondary | ICD-10-CM | POA: Diagnosis not present

## 2023-08-01 DIAGNOSIS — S060X0A Concussion without loss of consciousness, initial encounter: Secondary | ICD-10-CM | POA: Diagnosis not present

## 2023-08-01 DIAGNOSIS — S0083XA Contusion of other part of head, initial encounter: Secondary | ICD-10-CM

## 2023-08-01 DIAGNOSIS — S0990XA Unspecified injury of head, initial encounter: Secondary | ICD-10-CM | POA: Diagnosis present

## 2023-08-01 LAB — PREGNANCY, URINE: Preg Test, Ur: NEGATIVE
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
## 2023-08-01 NOTE — ED Notes (Signed)
 Pt has returned to lobby, states she has light sensitivity and was outside waiting the entire time. Arrives back with desire for treatment
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
## 2023-08-02 MED ORDER — OXYCODONE-ACETAMINOPHEN 5-325 MG PO TABS
1.0000 | ORAL_TABLET | Freq: Once | ORAL | Status: AC
  Administered 2023-08-02: 1 via ORAL
  Filled 2023-08-02: qty 1

## 2023-08-02 MED ORDER — KETOROLAC TROMETHAMINE 30 MG/ML IJ SOLN
30.0000 mg | Freq: Once | INTRAMUSCULAR | Status: AC
  Administered 2023-08-02: 30 mg via INTRAMUSCULAR
  Filled 2023-08-02: qty 1
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
## 2023-09-06 ENCOUNTER — Emergency Department (HOSPITAL_COMMUNITY)

## 2023-09-06 ENCOUNTER — Emergency Department (HOSPITAL_COMMUNITY)
Admission: EM | Admit: 2023-09-06 | Discharge: 2023-09-06 | Disposition: A | Discharge status: Home / Self Care | Attending: Emergency Medicine | Admitting: Emergency Medicine

## 2023-09-06 ENCOUNTER — Encounter (HOSPITAL_COMMUNITY): Payer: Self-pay

## 2023-09-06 ENCOUNTER — Other Ambulatory Visit: Payer: Self-pay

## 2023-09-06 DIAGNOSIS — O26891 Other specified pregnancy related conditions, first trimester: Secondary | ICD-10-CM | POA: Diagnosis present

## 2023-09-06 DIAGNOSIS — N946 Dysmenorrhea, unspecified: Secondary | ICD-10-CM | POA: Diagnosis not present

## 2023-09-06 DIAGNOSIS — Z3A08 8 weeks gestation of pregnancy: Secondary | ICD-10-CM | POA: Diagnosis not present

## 2023-09-06 DIAGNOSIS — E876 Hypokalemia: Secondary | ICD-10-CM | POA: Diagnosis not present

## 2023-09-06 LAB — CBC WITH DIFFERENTIAL/PLATELET
Abs Immature Granulocytes: 0.01 K/uL (ref 0.00–0.07)
Basophils Absolute: 0.1 K/uL (ref 0.0–0.1)
Basophils Relative: 1 %
Eosinophils Absolute: 0.1 K/uL (ref 0.0–0.5)
Eosinophils Relative: 1 %
HCT: 41.5 % (ref 36.0–46.0)
Hemoglobin: 13.3 g/dL (ref 12.0–15.0)
Immature Granulocytes: 0 %
Lymphocytes Relative: 45 %
Lymphs Abs: 2.9 K/uL (ref 0.7–4.0)
MCH: 32.3 pg (ref 26.0–34.0)
MCHC: 32 g/dL (ref 30.0–36.0)
MCV: 100.7 fL — ABNORMAL HIGH (ref 80.0–100.0)
Monocytes Absolute: 0.8 K/uL (ref 0.1–1.0)
Monocytes Relative: 12 %
Neutro Abs: 2.6 K/uL (ref 1.7–7.7)
Neutrophils Relative %: 41 %
Platelets: 248 K/uL (ref 150–400)
RBC: 4.12 MIL/uL (ref 3.87–5.11)
RDW: 11.4 % — ABNORMAL LOW (ref 11.5–15.5)
WBC: 6.4 K/uL (ref 4.0–10.5)
nRBC: 0 % (ref 0.0–0.2)

## 2023-09-06 LAB — BASIC METABOLIC PANEL WITH GFR
Anion gap: 12 (ref 5–15)
BUN: 13 mg/dL (ref 6–20)
CO2: 25 mmol/L (ref 22–32)
Calcium: 9.7 mg/dL (ref 8.9–10.3)
Chloride: 100 mmol/L (ref 98–111)
Creatinine, Ser: 0.71 mg/dL (ref 0.44–1.00)
GFR, Estimated: >60 mL/min (ref >60)
Glucose, Bld: 93 mg/dL (ref 70–99)
Potassium: 3.3 mmol/L — ABNORMAL LOW (ref 3.5–5.1)
Sodium: 137 mmol/L (ref 135–145)

## 2023-09-06 LAB — HCG, QUANTITATIVE, PREGNANCY: hCG, Beta Chain, Quant, S: <1 m[IU]/mL (ref <5)

## 2023-09-06 LAB — TYPE AND SCREEN
ABO/RH(D): O POS
Antibody Screen: NEGATIVE

## 2023-09-06 MED ORDER — ONDANSETRON HCL 4 MG/2ML IJ SOLN
4.0000 mg | Freq: Once | INTRAMUSCULAR | Status: AC
  Administered 2023-09-06: 4 mg via INTRAVENOUS
  Filled 2023-09-06: qty 2

## 2023-09-06 MED ORDER — HYDROMORPHONE HCL 1 MG/ML IJ SOLN
1.0000 mg | Freq: Once | INTRAMUSCULAR | Status: AC
  Administered 2023-09-06: 1 mg via INTRAVENOUS
  Filled 2023-09-06: qty 1

## 2023-09-06 MED ORDER — HYDROMORPHONE HCL 1 MG/ML IJ SOLN
0.5000 mg | Freq: Once | INTRAMUSCULAR | Status: AC
  Administered 2023-09-06: 0.5 mg via INTRAVENOUS
  Filled 2023-09-06: qty 1

## 2023-09-06 MED ORDER — LACTATED RINGERS IV BOLUS
1000.0000 mL | Freq: Once | INTRAVENOUS | Status: AC
  Administered 2023-09-06: 1000 mL via INTRAVENOUS

## 2023-09-06 MED ORDER — IBUPROFEN 800 MG PO TABS: 800.0000 mg | ORAL_TABLET | Freq: Three times a day (TID) | ORAL | 0 refills | Status: AC

## 2023-09-06 NOTE — ED Provider Notes (Signed)
 Signout received on this 28 year old female.  See previous note for full details.  Pending pelvic ultrasound at the end of shift change.  hCG has been negative.  Ultrasound to rule out torsion. Physical Exam  BP 119/70   Pulse 99   Temp 98 F (36.7 C)   Resp 18   Ht 5' 4 (1.626 m)   Wt 60.8 kg   SpO2 100%   BMI 23.00 kg/m     Procedures  Procedures  ED Course / MDM    Medical Decision Making Amount and/or Complexity of Data Reviewed Labs: ordered. Radiology: ordered.  Risk Prescription drug management.   Ultrasound with corpus luteum otherwise no acute findings.  No torsion or other concerning findings for her symptoms.  She will follow-up with her GYN.  She is stable for discharge. She states she had a blood clot once yesterday.  No other clots since then.  Bleeding is slowing down.  Will have her follow-up with GYN.  Strict return precautions discussed.  We did have a discussion regarding starting medicine to slow down the bleeding but since she is hemodynamically stable, and without symptoms she is in agreement to defer this for now and follow-up with GYN with strict return precautions.       Hildegard Loge, PA-C 09/06/23 [id number]    Charlyn Sora, MD 09/07/23 [phone]

## 2023-09-06 NOTE — ED Triage Notes (Signed)
 Pt reports being [redacted] weeks pregnant and began having lower abdominal cramping 2 days ago along with light bleeding. Pt initially thought this was just bleeding d/t implantation. Pt reports yesterday evening noticing increased bleeding as well as passing tissue. Pt coming in today d/t pain/bleeding increasing.  6/10 pain at this time.  Pt saturating 1 pad every 1-2 hrs at this time. Pt awake and alert. Denies dizziness. Mild associated weakness.

## 2023-09-06 NOTE — Discharge Instructions (Addendum)
 Ultrasound does not show any concerning findings.  No evidence of pregnancy on blood work or ultrasound.  Follow-up with your GYN.  Return for any emergent symptoms. If you develop chest pain, shortness of breath, or sensation that you are passing out return to the emergency room.  Otherwise please call the GYN clinic to establish care and follow-up. Take ibuprofen  600 mg for pain control.

## 2023-09-06 NOTE — ED Provider Notes (Signed)
 [facility] Emergency Department Provider Note MRN:  [id number]  Arrival date & time: 09/06/23     Chief Complaint   Threatened Miscarriage   History of Present Illness   Ashley Kent is a 28 y.o. year-old female presents to the ED with chief complaint of pelvic pain.  She states that she is approximately [redacted] weeks pregnant, but is concerned that she is miscarrying.  She states that she has been having heavy bleeding and believes that she saw passage of products of conception.  She continues to complain of pelvic pain and bleeding.  Denies treatments prior to arrival.  She states that she has not established with an OB/GYN yet.  History provided by patient.   Review of Systems  Pertinent positive and negative review of systems noted in HPI.    Physical Exam   Vitals:   09/06/23 0708 09/06/23 0819  BP:  115/68  Pulse:  92  Resp:  16  Temp: 98 F (36.7 C) 98 F (36.7 C)  SpO2:  100%    CONSTITUTIONAL:  uncomfortable-appearing, NAD NEURO:  Alert and oriented x 3, CN 3-12 grossly intact EYES:  eyes equal and reactive ENT/NECK:  Supple, no stridor  CARDIO:  normal rate, regular rhythm, appears well-perfused  PULM:  No respiratory distress, CTAB GI/GU:  non-distended,  MSK/SPINE:  No gross deformities, no edema, moves all extremities  SKIN:  no rash, atraumatic   *Additional and/or pertinent findings included in MDM below  Diagnostic and Interventional Summary    EKG Interpretation Date/Time:    Ventricular Rate:    PR Interval:    QRS Duration:    QT Interval:    QTC Calculation:   R Axis:      Text Interpretation:         Labs Reviewed  CBC WITH DIFFERENTIAL/PLATELET - Abnormal; Notable for the following components:      Result Value   MCV 100.7 (*)    RDW 11.4 (*)    All other components within normal limits  BASIC METABOLIC PANEL WITH GFR - Abnormal; Notable for the following components:   Potassium 3.3 (*)    All other  components within normal limits  HCG, QUANTITATIVE, PREGNANCY  TYPE AND SCREEN    US  Pelvis Complete  Final Result    US  Transvaginal Non-OB  Final Result    US  Art/Ven Flow Abd Pelv Doppler  Final Result      Medications  HYDROmorphone  (DILAUDID ) injection 0.5 mg (0.5 mg Intravenous Given 09/06/23 0337)  lactated ringers  bolus 1,000 mL (0 mLs Intravenous Stopped 09/06/23 0821)  ondansetron  (ZOFRAN ) injection 4 mg (4 mg Intravenous Given 09/06/23 0337)  HYDROmorphone  (DILAUDID ) injection 1 mg (1 mg Intravenous Given 09/06/23 0430)     Procedures  /  Critical Care Procedures  ED Course and Medical Decision Making  I have reviewed the triage vital signs, the nursing notes, and pertinent available records from the EMR.  Social Determinants Affecting Complexity of Care: Patient has no clinically significant social determinants affecting this chief complaint..   ED Course:    Medical Decision Making Patient here complaining of miscarriage.  She states that she is about [redacted] weeks pregnant.  She has had lower abdominal cramping with some bleeding and thinks that she passed products of conception.  Interestingly, beta-hCG is negative.  Ultrasound pending.  Labs are reassuring.  Patient's pain improved after treatment with IV Dilaudid .  Patient signed out to oncoming team, who will continue care.  Plan  is to follow-up on ultrasound.  Amount and/or Complexity of Data Reviewed Labs: ordered. Radiology: ordered.  Risk Prescription drug management.         Consultants:    Treatment and Plan: Patient signed out at shift change.    Final Clinical Impressions(s) / ED Diagnoses     ICD-10-CM   1. Dysmenorrhea  N94.6       ED Discharge Orders          Ordered    ibuprofen  (ADVIL ) 800 MG tablet  3 times daily        09/06/23 0645              Discharge Instructions Discussed with and Provided to Patient:     Discharge Instructions      Ultrasound does  not show any concerning findings.  No evidence of pregnancy on blood work or ultrasound.  Follow-up with your GYN.  Return for any emergent symptoms. If you develop chest pain, shortness of breath, or sensation that you are passing out return to the emergency room.  Otherwise please call the GYN clinic to establish care and follow-up. Take ibuprofen  600 mg for pain control.       Vicky Charleston, PA-C 09/06/23 2200    Theadore Ozell HERO, MD 09/07/23 [phone]
